# Patient Record
Sex: Female | Born: 1985
Health system: Southern US, Community
[De-identification: ages and names within clinical notes are randomized; demographics above are authoritative.]

## PROBLEM LIST (undated history)

## (undated) DIAGNOSIS — I10 Essential (primary) hypertension: Secondary | ICD-10-CM

---

## 2005-06-12 HISTORY — PX: TUBAL LIGATION: SHX77

## 2005-12-29 ENCOUNTER — Ambulatory Visit (HOSPITAL_COMMUNITY): Admission: RE | Admit: 2005-12-29 | Discharge: 2005-12-29 | Payer: Self-pay | Admitting: Obstetrics and Gynecology

## 2010-08-08 ENCOUNTER — Emergency Department (HOSPITAL_BASED_OUTPATIENT_CLINIC_OR_DEPARTMENT_OTHER)
Admission: EM | Admit: 2010-08-08 | Discharge: 2010-08-08 | Disposition: A | Discharge status: Home / Self Care | Payer: Self-pay | Attending: Emergency Medicine | Admitting: Emergency Medicine

## 2010-08-08 DIAGNOSIS — N760 Acute vaginitis: Secondary | ICD-10-CM | POA: Insufficient documentation

## 2010-08-08 DIAGNOSIS — R1032 Left lower quadrant pain: Secondary | ICD-10-CM | POA: Insufficient documentation

## 2010-08-08 DIAGNOSIS — B9689 Other specified bacterial agents as the cause of diseases classified elsewhere: Secondary | ICD-10-CM | POA: Insufficient documentation

## 2010-08-08 DIAGNOSIS — N920 Excessive and frequent menstruation with regular cycle: Secondary | ICD-10-CM | POA: Insufficient documentation

## 2010-08-08 DIAGNOSIS — F17200 Nicotine dependence, unspecified, uncomplicated: Secondary | ICD-10-CM | POA: Insufficient documentation

## 2010-08-08 DIAGNOSIS — A499 Bacterial infection, unspecified: Secondary | ICD-10-CM | POA: Insufficient documentation

## 2010-08-08 LAB — URINALYSIS, ROUTINE W REFLEX MICROSCOPIC
Protein, ur: NEGATIVE mg/dL
Urine Glucose, Fasting: NEGATIVE mg/dL
Urobilinogen, UA: 1 mg/dL (ref 0.0–1.0)

## 2010-08-08 LAB — WET PREP, GENITAL: Trich, Wet Prep: NONE SEEN

## 2010-08-08 LAB — URINE MICROSCOPIC-ADD ON

## 2013-12-29 ENCOUNTER — Emergency Department (HOSPITAL_BASED_OUTPATIENT_CLINIC_OR_DEPARTMENT_OTHER)
Admission: EM | Admit: 2013-12-29 | Discharge: 2013-12-29 | Disposition: A | Discharge status: Home / Self Care | Payer: Medicaid Other | Attending: Emergency Medicine | Admitting: Emergency Medicine

## 2013-12-29 ENCOUNTER — Encounter (HOSPITAL_BASED_OUTPATIENT_CLINIC_OR_DEPARTMENT_OTHER): Payer: Self-pay | Admitting: Emergency Medicine

## 2013-12-29 DIAGNOSIS — F17200 Nicotine dependence, unspecified, uncomplicated: Secondary | ICD-10-CM | POA: Insufficient documentation

## 2013-12-29 DIAGNOSIS — M549 Dorsalgia, unspecified: Secondary | ICD-10-CM | POA: Diagnosis not present

## 2013-12-29 DIAGNOSIS — I10 Essential (primary) hypertension: Secondary | ICD-10-CM | POA: Insufficient documentation

## 2013-12-29 DIAGNOSIS — R6884 Jaw pain: Secondary | ICD-10-CM | POA: Diagnosis present

## 2013-12-29 DIAGNOSIS — Z79899 Other long term (current) drug therapy: Secondary | ICD-10-CM | POA: Diagnosis not present

## 2013-12-29 HISTORY — DX: Essential (primary) hypertension: I10

## 2013-12-29 MED ORDER — CYCLOBENZAPRINE HCL 10 MG PO TABS: 10.0000 mg | ORAL_TABLET | Freq: Two times a day (BID) | ORAL | Status: DC | PRN

## 2013-12-29 MED ORDER — OXYCODONE-ACETAMINOPHEN 5-325 MG PO TABS
1.0000 | ORAL_TABLET | Freq: Once | ORAL | Status: AC
  Administered 2013-12-29: 1 via ORAL
  Filled 2013-12-29: qty 1

## 2013-12-29 NOTE — ED Provider Notes (Signed)
CSN: [id number]     Arrival date & time 12/29/13  2128 History   First MD Initiated Contact with Patient 12/29/13 2227     Chief Complaint  Patient presents with  . Jaw Pain     (Consider location/radiation/quality/duration/timing/severity/associated sxs/prior Treatment) The history is provided by the patient.    Pt reports she experienced about 5 minutes of sharp pains in both sides of her jaw, indicated around TMJ bilaterally.  Pain was sharp and radiated upward, felt the sensation as if her face was swelling.  It resolved spontaneously and returned again temporarily.  Was having difficulty raising and lowering jaw at the time secondary to pain.  This occurred around 8:30pm, while sitting and talking.  Denies fevers, ear pain, dental pain, sore throat.  She does not grind her teeth or clench her jaw but notes she has been stressed recently.  Notes her symptoms are completely resolved now and have been for several hours.  Also denies CP, SOB, cough.   Has also had mild soreness of her left upper back x 2 days.    Past Medical History  Diagnosis Date  . Hypertension    Past Surgical History  Procedure Laterality Date  . Cesarean section     No family history on file. History  Substance Use Topics  . Smoking status: Current Every Day Smoker -- 0.50 packs/day    Types: Cigarettes  . Smokeless tobacco: Not on file  . Alcohol Use: No   OB History   Grav Para Term Preterm Abortions TAB SAB Ect Mult Living                 Review of Systems  All other systems reviewed and are negative.     Allergies  Review of patient's allergies indicates no known allergies.  Home Medications   Prior to Admission medications   Medication Sig Start Date End Date Taking? Authorizing Provider  cyclobenzaprine (FLEXERIL) 10 MG tablet Take 1 tablet (10 mg total) by mouth 2 (two) times daily as needed for muscle spasms (or pain). 12/29/13   Trixie Dredge, PA-C   BP 128/78  Pulse 114  Temp(Src)  98.2 F (36.8 C) (Oral)  Resp 18  Ht 5\' 1"  (1.549 m)  Wt 137 lb (62.143 kg)  BMI 25.90 kg/m2  SpO2 100%  LMP 12/24/2013 Physical Exam  Nursing note and vitals reviewed. Constitutional: She appears well-developed and well-nourished. No distress.  HENT:  Head: Normocephalic and atraumatic.  Mouth/Throat: Uvula is midline, oropharynx is clear and moist and mucous membranes are normal. Normal dentition.  TMJs located, nontender bilaterally. Pt raises and lowers jaw without pain.  Dentition normal, no pain with percussion.  Bilateral TMs, canals normal.  No mastoid tenderness bilaterally.  No palpable lymphadenopathy of the head or neck.    Neck: Normal range of motion. Neck supple.  Cardiovascular: Normal rate and regular rhythm.   Pulmonary/Chest: Effort normal and breath sounds normal. No respiratory distress. She has no wheezes. She has no rales.  Abdominal: Soft. She exhibits no distension. There is no tenderness. There is no rebound and no guarding.  Musculoskeletal:       Arms: Spine nontender, no crepitus, or stepoffs.   Neurological: She is alert.  Skin: She is not diaphoretic.    ED Course  Procedures (including critical care time) Labs Review Labs Reviewed - No data to display  Imaging Review No results found.   EKG Interpretation   Date/Time:  Monday December 29 2013 21:45:45  EDT Ventricular Rate:  108 PR Interval:  160 QRS Duration: 80 QT Interval:  326 QTC Calculation: 436 R Axis:   48 Text Interpretation:  Sinus tachycardia Right atrial enlargement  Borderline ECG No old tracing to compare Confirmed by [facility]  MD, MARTHA  [phone]([id number]) on 12/29/2013 10:48:54 PM      MDM   Final diagnoses:  Jaw pain  Upper back pain on left side    Pt with bilateral jaw pain in the area of the TMJ - pt's description sounds like possible muscle spasm.  No s/s of infection - ears, dentition, pharynx, salivary glands, lymph nodes - all exam unremarkable.  Mild tenderness of left  trapezius.  No bony tenderness.  Pt and partner concerned it might be caused by her BP, which is actually quite good.  Pt tachycardic on arrival but not on my exam.  Pt d/c home with flexeril, PCP follow up.  Discussed findings, treatment, and follow up  with patient.  Pt given return precautions.  Pt verbalizes understanding and agrees with plan.          FairviewEmily Jamilya Sarrazin, PA-C 12/30/13 0022

## 2013-12-29 NOTE — Discharge Instructions (Signed)
Read the information below.  Use the prescribed medication as directed.  Please discuss all new medications with your pharmacist.  You may return to the Emergency Department at any time for worsening condition or any new symptoms that concern you.  If there is any possibility that you might be pregnant, please let your health care provider know and discuss this with the pharmacist to ensure medication safety.  If you develop fevers, ear pain, dental pain, sore throat, difficulty swallowing or breathing, swelling in your face, difficulty raising or lowering your jaw, return to the Emergency Department for a recheck.     Musculoskeletal Pain Musculoskeletal pain is muscle and boney aches and pains. These pains can occur in any part of the body. Your caregiver may treat you without knowing the cause of the pain. They may treat you if blood or urine tests, X-rays, and other tests were normal.  CAUSES There is often not a definite cause or reason for these pains. These pains may be caused by a type of germ (virus). The discomfort may also come from overuse. Overuse includes working out too hard when your body is not fit. Boney aches also come from weather changes. Bone is sensitive to atmospheric pressure changes. HOME CARE INSTRUCTIONS   Ask when your test results will be ready. Make sure you get your test results.  Only take over-the-counter or prescription medicines for pain, discomfort, or fever as directed by your caregiver. If you were given medications for your condition, do not drive, operate machinery or power tools, or sign legal documents for 24 hours. Do not drink alcohol. Do not take sleeping pills or other medications that may interfere with treatment.  Continue all activities unless the activities cause more pain. When the pain lessens, slowly resume normal activities. Gradually increase the intensity and duration of the activities or exercise.  During periods of severe pain, bed rest may be  helpful. Lay or sit in any position that is comfortable.  Putting ice on the injured area.  Put ice in a bag.  Place a towel between your skin and the bag.  Leave the ice on for 15 to 20 minutes, 3 to 4 times a day.  Follow up with your caregiver for continued problems and no reason can be found for the pain. If the pain becomes worse or does not go away, it may be necessary to repeat tests or do additional testing. Your caregiver may need to look further for a possible cause. SEEK IMMEDIATE MEDICAL CARE IF:  You have pain that is getting worse and is not relieved by medications.  You develop chest pain that is associated with shortness or breath, sweating, feeling sick to your stomach (nauseous), or throw up (vomit).  Your pain becomes localized to the abdomen.  You develop any new symptoms that seem different or that concern you. MAKE SURE YOU:   Understand these instructions.  Will watch your condition.  Will get help right away if you are not doing well or get worse. Document Released: 05/29/2005 Document Revised: 08/21/2011 Document Reviewed: 01/31/2013 [facility] Patient Information 2015 [place]. This information is not intended to replace advice given to you by your health care provider. Make sure you discuss any questions you have with your health care provider.

## 2013-12-29 NOTE — ED Notes (Signed)
Jaw pain. Sharp pain on both sides of her mouth when she opened her mouth an hour ago. Now she is having pain in her upper jaw to her ears and neck.

## 2013-12-30 NOTE — ED Provider Notes (Signed)
Medical screening examination/treatment/procedure(s) were performed by non-physician practitioner and as supervising physician I was immediately available for consultation/collaboration.   EKG Interpretation   Date/Time:  Monday December 29 2013 21:45:45 EDT Ventricular Rate:  108 PR Interval:  160 QRS Duration: 80 QT Interval:  326 QTC Calculation: 436 R Axis:   48 Text Interpretation:  Sinus tachycardia Right atrial enlargement  Borderline ECG No old tracing to compare Confirmed by [facility]  MD, MARTHA  [phone]) on 12/29/2013 10:48:54 PM        Alexa BeersJohn L Peyten Weare, MD 12/30/13 0500

## 2015-01-18 ENCOUNTER — Emergency Department (HOSPITAL_BASED_OUTPATIENT_CLINIC_OR_DEPARTMENT_OTHER)
Admission: EM | Admit: 2015-01-18 | Discharge: 2015-01-18 | Disposition: A | Discharge status: Home / Self Care | Payer: Self-pay | Attending: Emergency Medicine | Admitting: Emergency Medicine

## 2015-01-18 ENCOUNTER — Encounter (HOSPITAL_BASED_OUTPATIENT_CLINIC_OR_DEPARTMENT_OTHER): Payer: Self-pay

## 2015-01-18 DIAGNOSIS — I10 Essential (primary) hypertension: Secondary | ICD-10-CM | POA: Insufficient documentation

## 2015-01-18 DIAGNOSIS — R600 Localized edema: Secondary | ICD-10-CM | POA: Insufficient documentation

## 2015-01-18 DIAGNOSIS — Z720 Tobacco use: Secondary | ICD-10-CM | POA: Insufficient documentation

## 2015-01-18 DIAGNOSIS — R609 Edema, unspecified: Secondary | ICD-10-CM

## 2015-01-18 MED ORDER — HYDROCHLOROTHIAZIDE 12.5 MG PO TABS: 12.5000 mg | ORAL_TABLET | Freq: Every day | ORAL | Status: DC

## 2015-01-18 NOTE — ED Provider Notes (Signed)
CSN: [id number]     Arrival date & time 01/18/15  1315 History   First MD Initiated Contact with Patient 01/18/15 1339     Chief Complaint  Patient presents with  . Joint Swelling     (Consider location/radiation/quality/duration/timing/severity/associated sxs/prior Treatment) The history is provided by the patient and medical records. No language interpreter was used.     Alexa Hodge is a 29 y.o. female  with a hx of HTN presents to the Emergency Department complaining of waxing and waning swelling of her hands and feet onset 3 years.  Pt reports a Hx of HTN, but takes not medication for this.  Pt reports she was taking a medication for her HTN that also helped with her swelling.  She reports this medication was given to her last year.  Pt reports she drinks lots of water, but eats mixed nuts and drinks large amounts of gatorade, tea and soda.  Pt reports her swelling is always worse after days when she drinks gatorade or eats salty foods.  Pt denies fever, chills, headache, neck pain, chest pain, abd pain, N/V/D, weakness, syncope, dysuria.  Pt denies polyuria, polydipsia or polyphagia.    Pt also reports that 2 days ago she developed a burning feeling in the skin under her eyes bilaterally.  Denies rash, changes in soap, detergent or make-up.  Pt reports she thinks her face was swollen last night and she has had episodes of dizziness, but none currently.  No Syncope or near syncope.     Past Medical History  Diagnosis Date  . Hypertension    Past Surgical History  Procedure Laterality Date  . Cesarean section     No family history on file. History  Substance Use Topics  . Smoking status: Current Every Day Smoker  . Smokeless tobacco: Not on file  . Alcohol Use: Yes   OB History    No data available     Review of Systems  Constitutional: Negative for fever, diaphoresis, appetite change, fatigue and unexpected weight change.  HENT: Negative for mouth sores.   Eyes: Negative  for visual disturbance.  Respiratory: Negative for cough, chest tightness, shortness of breath and wheezing.   Cardiovascular: Negative for chest pain.       Hand and foot swelling  Gastrointestinal: Negative for nausea, vomiting, abdominal pain, diarrhea and constipation.  Endocrine: Negative for polydipsia, polyphagia and polyuria.  Genitourinary: Negative for dysuria, urgency, frequency and hematuria.  Musculoskeletal: Negative for back pain and neck stiffness.  Skin: Negative for rash.  Allergic/Immunologic: Negative for immunocompromised state.  Neurological: Negative for syncope, light-headedness and headaches.  Hematological: Does not bruise/bleed easily.  Psychiatric/Behavioral: Negative for sleep disturbance. The patient is not nervous/anxious.       Allergies  Review of patient's allergies indicates no known allergies.  Home Medications   Prior to Admission medications   Medication Sig Start Date End Date Taking? Authorizing Provider  hydrochlorothiazide (HYDRODIURIL) 12.5 MG tablet Take 1 tablet (12.5 mg total) by mouth daily. 01/18/15   Dandre Sisler, PA-C   BP 113/79 mmHg  Pulse 84  Temp(Src) 98.4 F (36.9 C) (Oral)  Resp 16  Ht  (1.549 m)  Wt 135 lb (61.236 kg)  BMI 25.52 kg/m2  SpO2 100%  LMP 01/04/2015 Physical Exam  Constitutional: She is oriented to person, place, and time. She appears well-developed and well-nourished. No distress.  Awake, alert, nontoxic appearance  HENT:  Head: Normocephalic and atraumatic.  Right Ear: Tympanic membrane, external ear  and ear canal normal.  Left Ear: Tympanic membrane, external ear and ear canal normal.  Nose: Nose normal. No epistaxis. Right sinus exhibits no maxillary sinus tenderness and no frontal sinus tenderness. Left sinus exhibits no maxillary sinus tenderness and no frontal sinus tenderness.  Mouth/Throat: Uvula is midline, oropharynx is clear and moist and mucous membranes are normal. Mucous membranes  are not pale and not cyanotic. No oropharyngeal exudate, posterior oropharyngeal edema, posterior oropharyngeal erythema or tonsillar abscesses.  Dry skin noted to the skin under her bilateral eyes,  No rash No facial or oropharyngeal edema  Eyes: Conjunctivae are normal. Pupils are equal, round, and reactive to light. No scleral icterus.  Neck: Normal range of motion and full passive range of motion without pain. Neck supple.  Cardiovascular: Normal rate, regular rhythm, normal heart sounds and intact distal pulses.   No murmur heard. Pulmonary/Chest: Effort normal and breath sounds normal. No stridor. No respiratory distress. She has no wheezes.  Equal chest expansion Clear and equal breath sounds  Abdominal: Soft. Bowel sounds are normal. She exhibits no mass. There is no tenderness. There is no rebound and no guarding.  Musculoskeletal: Normal range of motion. She exhibits no edema.  No edema of the upper or lower extremities noted  Lymphadenopathy:    She has no cervical adenopathy.  Neurological: She is alert and oriented to person, place, and time.  Speech is clear and goal oriented Moves extremities without ataxia  Skin: Skin is warm and dry. No rash noted. She is not diaphoretic.  Psychiatric: She has a normal mood and affect.  Nursing note and vitals reviewed.   ED Course  Procedures (including critical care time) Labs Review Labs Reviewed - No data to display  Imaging Review No results found.   EKG Interpretation None      MDM   Final diagnoses:  Edema  Essential hypertension   Alexa Hodge presents with complaints of intermittent hand and foot swelling.  No evidence of edema today; specifically no pitting edema.  No significant hypertension noted today.  Patient requests restarting her diuretic.  Counseled on diet, adequate water intake and symptoms of hypotension with diuretic usage. She is to follow-up in 3-5 days with her primary care physician.  No hx of  kidney dysfunction.    Burning underneath patient's eyes appears to be dry skin. There is no evidence of allergic reaction. No rash or peeling skin. Recommend moisturizers and more moisturizing face soap.  BP 113/79 mmHg  Pulse 84  Temp(Src) 98.4 F (36.9 C) (Oral)  Resp 16  Ht 5\' 1"  (1.549 m)  Wt 135 lb (61.236 kg)  BMI 25.52 kg/m2  SpO2 100%  LMP 01/04/2015   Dierdre Forth, PA-C 01/18/15 1453  Mirian Mo, MD 01/20/15 2253

## 2015-01-18 NOTE — ED Notes (Signed)
C/o swelling to feet, hands x 3 years-swelling to face and dizziness x 2 days

## 2015-01-18 NOTE — Discharge Instructions (Signed)
1. Medications: HCTZ, usual home medications 2. Treatment: rest, drink plenty of fluids, decrease salt intake 3. Follow Up: Please followup with your primary doctor in 3-5 days for discussion of your diagnoses and further evaluation after today's visit; if you do not have a primary care doctor use the resource guide provided to find one; Please return to the ER for worsening symptoms    DASH Eating Plan DASH stands for "Dietary Approaches to Stop Hypertension." The DASH eating plan is a healthy eating plan that has been shown to reduce high blood pressure (hypertension). Additional health benefits may include reducing the risk of type 2 diabetes mellitus, heart disease, and stroke. The DASH eating plan may also help with weight loss. WHAT DO I NEED TO KNOW ABOUT THE DASH EATING PLAN? For the DASH eating plan, you will follow these general guidelines:  Choose foods with a percent daily value for sodium of less than 5% (as listed on the food label).  Use salt-free seasonings or herbs instead of table salt or sea salt.  Check with your health care provider or pharmacist before using salt substitutes.  Eat lower-sodium products, often labeled as "lower sodium" or "no salt added."  Eat fresh foods.  Eat more vegetables, fruits, and low-fat dairy products.  Choose whole grains. Look for the word "whole" as the first word in the ingredient list.  Choose fish and skinless chicken or [place] more often than red meat. Limit fish, poultry, and meat to 6 oz (170 g) each day.  Limit sweets, desserts, sugars, and sugary drinks.  Choose heart-healthy fats.  Limit cheese to 1 oz (28 g) per day.  Eat more home-cooked food and less restaurant, buffet, and fast food.  Limit fried foods.  Cook foods using methods other than frying.  Limit canned vegetables. If you do use them, rinse them well to decrease the sodium.  When eating at a restaurant, ask that your food be prepared with less salt, or  no salt if possible. WHAT FOODS CAN I EAT? Seek help from a dietitian for individual calorie needs. Grains Whole grain or whole wheat bread. Brown rice. Whole grain or whole wheat pasta. Quinoa, bulgur, and whole grain cereals. Low-sodium cereals. Corn or whole wheat flour tortillas. Whole grain cornbread. Whole grain crackers. Low-sodium crackers. Vegetables Fresh or frozen vegetables (raw, steamed, roasted, or grilled). Low-sodium or reduced-sodium tomato and vegetable juices. Low-sodium or reduced-sodium tomato sauce and paste. Low-sodium or reduced-sodium canned vegetables.  Fruits All fresh, canned (in natural juice), or frozen fruits. Meat and Other Protein Products Ground beef (85% or leaner), grass-fed beef, or beef trimmed of fat. Skinless chicken or [place]. Ground chicken or [place]. Pork trimmed of fat. All fish and seafood. Eggs. Dried beans, peas, or lentils. Unsalted nuts and seeds. Unsalted canned beans. Dairy Low-fat dairy products, such as skim or 1% milk, 2% or reduced-fat cheeses, low-fat ricotta or cottage cheese, or plain low-fat yogurt. Low-sodium or reduced-sodium cheeses. Fats and Oils Tub margarines without trans fats. Light or reduced-fat mayonnaise and salad dressings (reduced sodium). Avocado. Safflower, olive, or canola oils. Natural peanut or almond butter. Other Unsalted popcorn and pretzels. The items listed above may not be a complete list of recommended foods or beverages. Contact your dietitian for more options. WHAT FOODS ARE NOT RECOMMENDED? Grains White bread. White pasta. White rice. Refined cornbread. Bagels and croissants. Crackers that contain trans fat. Vegetables Creamed or fried vegetables. Vegetables in a cheese sauce. Regular canned vegetables. Regular canned tomato sauce  and paste. Regular tomato and vegetable juices. Fruits Dried fruits. Canned fruit in light or heavy syrup. Fruit juice. Meat and Other Protein Products Fatty cuts of meat.  Ribs, chicken wings, bacon, sausage, bologna, salami, chitterlings, fatback, hot dogs, bratwurst, and packaged luncheon meats. Salted nuts and seeds. Canned beans with salt. Dairy Whole or 2% milk, cream, half-and-half, and cream cheese. Whole-fat or sweetened yogurt. Full-fat cheeses or blue cheese. Nondairy creamers and whipped toppings. Processed cheese, cheese spreads, or cheese curds. Condiments Onion and garlic salt, seasoned salt, table salt, and sea salt. Canned and packaged gravies. Worcestershire sauce. Tartar sauce. Barbecue sauce. Teriyaki sauce. Soy sauce, including reduced sodium. Steak sauce. Fish sauce. Oyster sauce. Cocktail sauce. Horseradish. Ketchup and mustard. Meat flavorings and tenderizers. Bouillon cubes. Hot sauce. Tabasco sauce. Marinades. Taco seasonings. Relishes. Fats and Oils Butter, stick margarine, lard, shortening, ghee, and bacon fat. Coconut, palm kernel, or palm oils. Regular salad dressings. Other Pickles and olives. Salted popcorn and pretzels. The items listed above may not be a complete list of foods and beverages to avoid. Contact your dietitian for more information. WHERE CAN I FIND MORE INFORMATION? National Heart, Lung, and Blood Institute: [employer] Document Released: 05/18/2011 Document Revised: 10/13/2013 Document Reviewed: 04/02/2013 [facility] Patient Information 2015 [place], [place]. This information is not intended to replace advice given to you by your health care provider. Make sure you discuss any questions you have with your health care provider.   Edema Edema is an abnormal buildup of fluids in your bodytissues. Edema is somewhatdependent on gravity to pull the fluid to the lowest place in your body. That makes the condition more common in the legs and thighs (lower extremities). Painless swelling of the feet and ankles is common and becomes more likely as you get older. It is also common in looser  tissues, like around your eyes.  When the affected area is squeezed, the fluid may move out of that spot and leave a dent for a few moments. This dent is called pitting.  CAUSES  There are many possible causes of edema. Eating too much salt and being on your feet or sitting for a long time can cause edema in your legs and ankles. Hot weather may make edema worse. Common medical causes of edema include:  Heart failure.  Liver disease.  Kidney disease.  Weak blood vessels in your legs.  Cancer.  An injury.  Pregnancy.  Some medications.  Obesity. SYMPTOMS  Edema is usually painless.Your skin may look swollen or shiny.  DIAGNOSIS  Your health care provider may be able to diagnose edema by asking about your medical history and doing a physical exam. You may need to have tests such as X-rays, an electrocardiogram, or blood tests to check for medical conditions that may cause edema.  TREATMENT  Edema treatment depends on the cause. If you have heart, liver, or kidney disease, you need the treatment appropriate for these conditions. General treatment may include:  Elevation of the affected body part above the level of your heart.  Compression of the affected body part. Pressure from elastic bandages or support stockings squeezes the tissues and forces fluid back into the blood vessels. This keeps fluid from entering the tissues.  Restriction of fluid and salt intake.  Use of a water pill (diuretic). These medications are appropriate only for some types of edema. They pull fluid out of your body and make you urinate more often. This gets rid of fluid and reduces swelling, but diuretics can  have side effects. Only use diuretics as directed by your health care provider. HOME CARE INSTRUCTIONS   Keep the affected body part above the level of your heart when you are lying down.   Do not sit still or stand for prolonged periods.   Do not put anything directly under your knees when  lying down.  Do not wear constricting clothing or garters on your upper legs.   Exercise your legs to work the fluid back into your blood vessels. This may help the swelling go down.   Wear elastic bandages or support stockings to reduce ankle swelling as directed by your health care provider.   Eat a low-salt diet to reduce fluid if your health care provider recommends it.   Only take medicines as directed by your health care provider. SEEK MEDICAL CARE IF:   Your edema is not responding to treatment.  You have heart, liver, or kidney disease and notice symptoms of edema.  You have edema in your legs that does not improve after elevating them.   You have sudden and unexplained weight gain. SEEK IMMEDIATE MEDICAL CARE IF:   You develop shortness of breath or chest pain.   You cannot breathe when you lie down.  You develop pain, redness, or warmth in the swollen areas.   You have heart, liver, or kidney disease and suddenly get edema.  You have a fever and your symptoms suddenly get worse. MAKE SURE YOU:   Understand these instructions.  Will watch your condition.  Will get help right away if you are not doing well or get worse. Document Released: 05/29/2005 Document Revised: 10/13/2013 Document Reviewed: 03/21/2013 [facility] Patient Information 2015 [place], [place]. This information is not intended to replace advice given to you by your health care provider. Make sure you discuss any questions you have with your health care provider.   Hypertension Hypertension, commonly called high blood pressure, is when the force of blood pumping through your arteries is too strong. Your arteries are the blood vessels that carry blood from your heart throughout your body. A blood pressure reading consists of a higher number over a lower number, such as 110/72. The higher number (systolic) is the pressure inside your arteries when your heart pumps. The lower number (diastolic) is  the pressure inside your arteries when your heart relaxes. Ideally you want your blood pressure below 120/80. Hypertension forces your heart to work harder to pump blood. Your arteries may become narrow or stiff. Having hypertension puts you at risk for heart disease, stroke, and other problems.  RISK FACTORS Some risk factors for high blood pressure are controllable. Others are not.  Risk factors you cannot control include:   Race. You may be at higher risk if you are African American.  Age. Risk increases with age.  Gender. Men are at higher risk than women before age 26 years. After age 67, women are at higher risk than men. Risk factors you can control include:  Not getting enough exercise or physical activity.  Being overweight.  Getting too much fat, sugar, calories, or salt in your diet.  Drinking too much alcohol. SIGNS AND SYMPTOMS Hypertension does not usually cause signs or symptoms. Extremely high blood pressure (hypertensive crisis) may cause headache, anxiety, shortness of breath, and nosebleed. DIAGNOSIS  To check if you have hypertension, your health care provider will measure your blood pressure while you are seated, with your arm held at the level of your heart. It should be measured at  least twice using the same arm. Certain conditions can cause a difference in blood pressure between your right and left arms. A blood pressure reading that is higher than normal on one occasion does not mean that you need treatment. If one blood pressure reading is high, ask your health care provider about having it checked again. TREATMENT  Treating high blood pressure includes making lifestyle changes and possibly taking medicine. Living a healthy lifestyle can help lower high blood pressure. You may need to change some of your habits. Lifestyle changes may include:  Following the DASH diet. This diet is high in fruits, vegetables, and whole grains. It is low in salt, red meat, and  added sugars.  Getting at least 2 hours of brisk physical activity every week.  Losing weight if necessary.  Not smoking.  Limiting alcoholic beverages.  Learning ways to reduce stress. If lifestyle changes are not enough to get your blood pressure under control, your health care provider may prescribe medicine. You may need to take more than one. Work closely with your health care provider to understand the risks and benefits. HOME CARE INSTRUCTIONS  Have your blood pressure rechecked as directed by your health care provider.   Take medicines only as directed by your health care provider. Follow the directions carefully. Blood pressure medicines must be taken as prescribed. The medicine does not work as well when you skip doses. Skipping doses also puts you at risk for problems.   Do not smoke.   Monitor your blood pressure at home as directed by your health care provider. SEEK MEDICAL CARE IF:   You think you are having a reaction to medicines taken.  You have recurrent headaches or feel dizzy.  You have swelling in your ankles.  You have trouble with your vision. SEEK IMMEDIATE MEDICAL CARE IF:  You develop a severe headache or confusion.  You have unusual weakness, numbness, or feel faint.  You have severe chest or abdominal pain.  You vomit repeatedly.  You have trouble breathing. MAKE SURE YOU:   Understand these instructions.  Will watch your condition.  Will get help right away if you are not doing well or get worse. Document Released: 05/29/2005 Document Revised: 10/13/2013 Document Reviewed: 03/21/2013 [facility] Patient Information 2015 [place], [place]. This information is not intended to replace advice given to you by your health care provider. Make sure you discuss any questions you have with your health care provider.    Emergency Department Resource Guide 1) Find a Doctor and Pay Out of Pocket Although you won't have to find out who is covered  by your insurance plan, it is a good idea to ask around and get recommendations. You will then need to call the office and see if the doctor you have chosen will accept you as a new patient and what types of options they offer for patients who are self-pay. Some doctors offer discounts or will set up payment plans for their patients who do not have insurance, but you will need to ask so you aren't surprised when you get to your appointment.  2) Contact Your Local Health Department Not all health departments have doctors that can see patients for sick visits, but many do, so it is worth a call to see if yours does. If you don't know where your local health department is, you can check in your phone book. The CDC also has a tool to help you locate your state's health department, and many state websites  also have listings of all of their local health departments.  3) Find a Walk-in Clinic If your illness is not likely to be very severe or complicated, you may want to try a walk in clinic. These are popping up all over the country in pharmacies, drugstores, and shopping centers. They're usually staffed by nurse practitioners or physician assistants that have been trained to treat common illnesses and complaints. They're usually fairly quick and inexpensive. However, if you have serious medical issues or chronic medical problems, these are probably not your best option.  No Primary Care Doctor: - Call Health Connect at  [phone] - they can help you locate a primary care doctor that  accepts your insurance, provides certain services, etc. - Physician Referral Service- [phone]  Chronic Pain Problems: Organization         Address  Phone   Notes  [facility]  [phone] Patients need to be referred by their primary care doctor.   Medication Assistance: Organization         Address  Phone   Notes  [facility] Medication [facility] [address] [place] [place], [place] [address] [phone] --Must be a resident of [facility] -- Must have NO insurance coverage whatsoever (no Medicaid/ Medicare, etc.) -- The pt. MUST have a primary care doctor that directs their care regularly and follows them in the community   [facility]  [phone]   [employer]  [phone]    Agencies that provide inexpensive medical care: Organization         Address  Phone   Notes  [facility]  [phone]   [facility]    [phone]   [facility] [address] [place], [place] [address] [phone]   [facility] [address], [place] [phone]   Planned Parenthood    [phone]   [facility]    [phone]   Community Health and [facility]  [address], [place] Phone:  [phone], Fax:  [phone] Hours of Operation:  9 am - 6 pm, M-F.  Also accepts Medicaid/Medicare and self-pay.  [facility]  [address], [place] Phone: [phone], Fax: [phone]. Hours of Operation:  8:30 am - 5:30 pm, M-F.  Also accepts Medicaid and self-pay.  [facility] [place] [address] [place] Phone: [phone]   [facility] [address] Natasha Bence [place], [place] [phone], Ext. [phone] Mondays & Thursdays: 7-9 AM.  First 15 patients are seen on a first come, first serve basis.    Medicaid-accepting [facility] Providers:  Organization         Address  Phone   Notes  [facility] [address], [place] [phone] Also accepts self-pay patients.  [facility] [address] Laurell Josephs [place], [place]  [phone]   [facility] [address], [place] [phone]   [facility] [address] [place] [phone]   Renaye Rakers [address], [place]   [phone] Only accepts [place] Access [place] patients after they have their name applied to their card.   Self-Pay (no insurance) in [facility]:  [employer]  Notes  Sickle Cell Patients, [facility] [address] [place], [place] [phone]   [facility] [address] [place], [place] [phone]   [facility]  [address], [place] [phone]   [facility]/Dr. Osei-Bonsu  [address], [place] or [address], [place] [phone] Phone number for both [place] and [place] locations is the same.  [facility] [address] [place] [phone]   [facility] [address] [place] or [address] [phone]   [facility] [address] [place] [phone], phone; [phone], fax Sees patients 1st and 3rd Saturday of every month.  Must not qualify for public or private insurance (i.e. Medicaid, Medicare, [place] Health Choice, Veterans' Benefits)  Household income should be no more than 200% of the poverty level The clinic cannot treat you if you are pregnant or think you are pregnant  Sexually transmitted diseases are not treated at the clinic.    Dental Care: Organization         Address  Phone  Notes  [facility] Department of [facility] [address] [place], [place] [phone] Accepts children up to age 35 who are enrolled in [place] or [place] Health Choice; pregnant women with a Medicaid card; and children who have applied for Medicaid or [place] Health Choice, but were declined, whose parents can pay a reduced fee at time of service.  [facility] Department of [facility]  [address], [place] [phone] Accepts children up to age 50 who are enrolled  in [place] or [place] Health Choice; pregnant women with a Medicaid card; and children who have applied for Medicaid or [place] Health Choice, but were declined, whose parents can pay a reduced fee at time of service.  [facility]  [address] [place], [place] [phone] Patients are seen by appointment only. Walk-ins are not accepted. [facility] will see patients 68 years of age and older. Monday - Tuesday (8am-5pm) Most Wednesdays (8:30-5pm) $30 per visit, cash only  [facility]  [address], [facility] [phone] Patients are seen by appointment only. Walk-ins are not accepted. [facility] will see patients 11 years of age and older. One Wednesday Evening (Monthly: Volunteer Based).  $30 per visit, cash only  [employer]  [phone] for adults; Children under age 51, call Graduate Pediatric Dentistry at [phone]. Children aged 51-14, please call [phone] to request a pediatric application.  Dental services are provided in all areas of dental care including fillings, crowns and bridges, complete and partial dentures, implants, gum treatment, root canals, and extractions. Preventive care is also provided. Treatment is provided to both adults and children. Patients are selected via a lottery and there is often a waiting list.   [facility] [address] [place]  [phone] [email or web address]   [facility] [address] [place], [place] [phone], Ext. [phone] Second and Fourth Thursday of each month, opens at 6:30 AM; Clinic ends at 9 AM.  Patients are seen on a first-come first-served basis, and a limited number are seen during each clinic.   [facility]  [address] Ether Griffins [place], [place] [phone]  Eligibility Requirements You must have lived in [place], [place], or [place] counties for at least the last three months.   You cannot be  eligible for state or federal sponsored [place], including [employer], [place], or [employer].   You generally cannot be eligible for healthcare insurance through your employer.    How to apply: Eligibility screenings are held every Tuesday and Wednesday afternoon from 1:00 pm until 4:00 pm. You do not need an appointment for the interview!  [facility] [address], [place], [place] [phone]   [facility] Health Department  [phone]   [facility] Health Department  [phone]   [facility] Health Department  [phone]    Behavioral Health Resources in the Community: Intensive Outpatient Programs Organization         Address  Phone  Notes  [facility] Services [address], [place], [place] [phone]   [facility]patient [address], [place], [place] [phone]   [facility] [address] [place], [place]  [phone]   [facility] Mental Health [address]  [place], [place] [phone] or [phone]   Substance Abuse Resources Organization         Address  Phone  Notes  [facility]  [phone]   [facility]  [phone]   [facility] [place] [facility]  [phone]   Floydene Flock  [phone]   Residential & Outpatient Substance Abuse Program  [phone]   Psychological Services Organization         Address  Phone  Notes  [facility] Behavioral Health  [phone]   [facility] Services  [phone]   [facility] Mental Health [address] [place] [phone] or [phone]    Mobile Crisis Teams Organization         Address  Phone  Notes  [facility], Mobile Crisis Care Unit  [phone]   [facility]  [address] [place], [place] [phone]   Doristine Locks [address] [place] [phone]    Self-Help/Support Groups Organization          Address  Phone             Notes  [facility] [place] - variety of support groups  [phone] Call for more information  Narcotics Anonymous (NA), [facility] [address], [employer] [place]  2 meetings at this location   Statistician         Address  Phone  Notes  [facility] [address],    [place]  [phone]   [facility]  [address] [place] [address], [place], [place] [phone]   [facility] [address] [place], [place] [phone] Admissions: 8am-3pm M-F  [facility] [address]    [place], [place] [phone]   [facility] [address]ling Manns [place], [place] [phone]   [facility] [address],  [place], [place] [phone]   [facility] [address], [place], [place] [phone]   [facility] [address] [place]  [place], [place] [phone] or [phone]   [facility] [address], [place], [place] [phone] Accepts Medicaid  [facility] [address],  [place] [phone] Substance Abuse/Addiction Treatment   [facility] Organization  Address  Phone  Notes  [facility]  [phone]   Angie Fava, PhD [address]vin Knack [place], [place]   [phone] or [phone]   [facility]   [address] [place], [place] [phone]   [facility] [address] [place], [place] [phone] Insurance/Medicaid/sponsorship through [facility] Families [address]                                    [place], [place] [phone] Therapy/tele-psych/case  [facility] [address], [place] [phone]    Dr. Lolly Mustache  [phone]   [facility]  [employer]  [facility] Dept. 1) [address] [place] 2) [address], [place] 3)  [address], [place] [phone]  [phone]   [facility] Child Abuse Hotline [phone] or [phone] (After Hours)

## 2015-01-18 NOTE — ED Notes (Signed)
Pt complains of facial, bilateral hands/feet swelling. No edema noted. Strong pedal pulses.

## 2015-01-19 ENCOUNTER — Encounter (HOSPITAL_BASED_OUTPATIENT_CLINIC_OR_DEPARTMENT_OTHER): Payer: Self-pay

## 2015-05-30 ENCOUNTER — Emergency Department (HOSPITAL_BASED_OUTPATIENT_CLINIC_OR_DEPARTMENT_OTHER)
Admission: EM | Admit: 2015-05-30 | Discharge: 2015-05-30 | Disposition: A | Discharge status: Home / Self Care | Payer: Medicaid Other | Attending: Emergency Medicine | Admitting: Emergency Medicine

## 2015-05-30 ENCOUNTER — Encounter (HOSPITAL_BASED_OUTPATIENT_CLINIC_OR_DEPARTMENT_OTHER): Payer: Self-pay | Admitting: Emergency Medicine

## 2015-05-30 DIAGNOSIS — I10 Essential (primary) hypertension: Secondary | ICD-10-CM | POA: Diagnosis not present

## 2015-05-30 DIAGNOSIS — L0291 Cutaneous abscess, unspecified: Secondary | ICD-10-CM

## 2015-05-30 DIAGNOSIS — L0231 Cutaneous abscess of buttock: Secondary | ICD-10-CM | POA: Diagnosis not present

## 2015-05-30 DIAGNOSIS — Z79899 Other long term (current) drug therapy: Secondary | ICD-10-CM | POA: Diagnosis not present

## 2015-05-30 DIAGNOSIS — F17210 Nicotine dependence, cigarettes, uncomplicated: Secondary | ICD-10-CM | POA: Insufficient documentation

## 2015-05-30 NOTE — Discharge Instructions (Signed)
Abscess °An abscess is an infected area that contains a collection of pus and debris. It can occur in almost any part of the body. An abscess is also known as a furuncle or boil. °CAUSES  °An abscess occurs when tissue gets infected. This can occur from blockage of oil or sweat glands, infection of hair follicles, or a minor injury to the skin. As the body tries to fight the infection, pus collects in the area and creates pressure under the skin. This pressure causes pain. People with weakened immune systems have difficulty fighting infections and get certain abscesses more often.  °SYMPTOMS °Usually an abscess develops on the skin and becomes a painful mass that is red, warm, and tender. If the abscess forms under the skin, you may feel a moveable soft area under the skin. Some abscesses break open (rupture) on their own, but most will continue to get worse without care. The infection can spread deeper into the body and eventually into the bloodstream, causing you to feel ill.  °DIAGNOSIS  °Your caregiver will take your medical history and perform a physical exam. A sample of fluid may also be taken from the abscess to determine what is causing your infection. °TREATMENT  °Your caregiver may prescribe antibiotic medicines to fight the infection. However, taking antibiotics alone usually does not cure an abscess. Your caregiver may need to make a small cut (incision) in the abscess to drain the pus. In some cases, gauze is packed into the abscess to reduce pain and to continue draining the area. °HOME CARE INSTRUCTIONS  °· Only take over-the-counter or prescription medicines for pain, discomfort, or fever as directed by your caregiver. °· If you were prescribed antibiotics, take them as directed. Finish them even if you start to feel better. °· If gauze is used, follow your caregiver's directions for changing the gauze. °· To avoid spreading the infection: °¨ Keep your draining abscess covered with a  bandage. °¨ Wash your hands well. °¨ Do not share personal care items, towels, or whirlpools with others. °¨ Avoid skin contact with others. °· Keep your skin and clothes clean around the abscess. °· Keep all follow-up appointments as directed by your caregiver. °SEEK MEDICAL CARE IF:  °· You have increased pain, swelling, redness, fluid drainage, or bleeding. °· You have muscle aches, chills, or a general ill feeling. °· You have a fever. °MAKE SURE YOU:  °· Understand these instructions. °· Will watch your condition. °· Will get help right away if you are not doing well or get worse. °  °This information is not intended to replace advice given to you by your health care provider. Make sure you discuss any questions you have with your health care provider. °  °Document Released: 03/08/2005 Document Revised: 11/28/2011 Document Reviewed: 08/11/2011 °Elsevier Interactive Patient Education ©2016 Elsevier Inc[employer] ° ° °Please read attached information. If you experience any new or worsening signs or symptoms please return to the emergency room for evaluation. Please follow-up with your primary care provider or specialist as discussed. Please use medication prescribed only as directed and discontinue taking if you have any concerning signs or symptoms.  °

## 2015-05-30 NOTE — ED Notes (Signed)
Pt states she'll take BP meds immediately when she gets home; PA-C aware--okay to discharge.

## 2015-05-30 NOTE — ED Provider Notes (Signed)
CSN: [id number]     Arrival date & time 05/30/15  1345 History   First MD Initiated Contact with Patient 05/30/15 1406     Chief Complaint  Patient presents with  . Recurrent Skin Infections    HPI   29 year old female presents today with an abscess to her gluteus. Patient reports a past significant medical history the same approximately 7 months ago that was treated with oral antibiotics that improved. Patient reports she's been asymptomatic until 3 days ago when she started developing swelling pain to the left buttock just posterior lateral to the coccyx. Patient reports that abscess drained yesterday and has significantly diminished in size. Husband requested she be seen and evaluated. Patient denies any surrounding cellulitis, fever, chills, nausea, vomiting, or any other concerning signs or symptoms for infectious etiology. Patient has not tried any over-the-counter medications prior to arrival    Past Medical History  Diagnosis Date  . Hypertension    Past Surgical History  Procedure Laterality Date  . Cesarean section     History reviewed. No pertinent family history. Social History  Substance Use Topics  . Smoking status: Current Every Day Smoker -- 0.50 packs/day    Types: Cigarettes  . Smokeless tobacco: None  . Alcohol Use: Yes   OB History    Gravida Para Term Preterm AB TAB SAB Ectopic Multiple Living   0 0 0 0 0 0 0 0       Review of Systems  All other systems reviewed and are negative.     Allergies  Review of patient's allergies indicates no known allergies.  Home Medications   Prior to Admission medications   Medication Sig Start Date End Date Taking? Authorizing Provider  cyclobenzaprine (FLEXERIL) 10 MG tablet Take 1 tablet (10 mg total) by mouth 2 (two) times daily as needed for muscle spasms (or pain). 12/29/13   Trixie Dredge, PA-C  hydrochlorothiazide (HYDRODIURIL) 12.5 MG tablet Take 1 tablet (12.5 mg total) by mouth daily. 01/18/15   Hannah  Muthersbaugh, PA-C   BP 139/106 mmHg  Pulse 84  Temp(Src) 98.6 F (37 C) (Oral)  Resp 18  Ht  (1.549 m)  Wt 63.186 kg  BMI 26.33 kg/m2  SpO2 100%  LMP 05/30/2015   Physical Exam  Constitutional: She is oriented to person, place, and time. She appears well-developed and well-nourished.  HENT:  Head: Normocephalic and atraumatic.  Eyes: Conjunctivae are normal. Pupils are equal, round, and reactive to light. Right eye exhibits no discharge. Left eye exhibits no discharge. No scleral icterus.  Neck: Normal range of motion. No JVD present. No tracheal deviation present.  Pulmonary/Chest: Effort normal. No stridor.  Neurological: She is alert and oriented to person, place, and time. Coordination normal.  Skin:  0.5 cm nodule with area of resolved discharge, minimally tender to palpation, no warmth to touch, no redness or, no fluctuance  Psychiatric: She has a normal mood and affect. Her behavior is normal. Judgment and thought content normal.  Nursing note and vitals reviewed.   ED Course  Procedures (including critical care time) Labs Review Labs Reviewed - No data to display  Imaging Review No results found. I have personally reviewed and evaluated these images and lab results as part of my medical decision-making.   EKG Interpretation None      MDM   Final diagnoses:  Abscess    Labs:  Imaging:  Consults:  Therapeutics:  Discharge Meds:   Assessment/Plan: 29 year old female presents today with resolving  abscess. Very small nodule with no signs of surrounding cellulitis, already drained, no systemic symptoms. No need for oral antibiotics, no drainable abscess. Patient will be instructed to use warm compresses, monitor for any new or worsening signs or symptoms and return if they present. Patient verbalized understanding and agreement with today's plan and had no further questions or concerns at time of discharge.         Eyvonne MechanicJeffrey Josemiguel Gries,  PA-C 05/30/15 1447  Nelva Nayobert Beaton, MD 05/31/15 1250

## 2015-05-30 NOTE — ED Notes (Signed)
Patient states that she was here a little while ago to have her abcess drained. The patient reports that it is back.

## 2016-04-22 ENCOUNTER — Encounter (HOSPITAL_BASED_OUTPATIENT_CLINIC_OR_DEPARTMENT_OTHER): Payer: Self-pay | Admitting: *Deleted

## 2016-04-22 ENCOUNTER — Emergency Department (HOSPITAL_BASED_OUTPATIENT_CLINIC_OR_DEPARTMENT_OTHER)
Admission: EM | Admit: 2016-04-22 | Discharge: 2016-04-22 | Disposition: A | Discharge status: Home / Self Care | Payer: Medicaid Other | Attending: Emergency Medicine | Admitting: Emergency Medicine

## 2016-04-22 DIAGNOSIS — X500XXA Overexertion from strenuous movement or load, initial encounter: Secondary | ICD-10-CM | POA: Insufficient documentation

## 2016-04-22 DIAGNOSIS — Z79899 Other long term (current) drug therapy: Secondary | ICD-10-CM | POA: Insufficient documentation

## 2016-04-22 DIAGNOSIS — S39012A Strain of muscle, fascia and tendon of lower back, initial encounter: Secondary | ICD-10-CM

## 2016-04-22 DIAGNOSIS — I10 Essential (primary) hypertension: Secondary | ICD-10-CM

## 2016-04-22 DIAGNOSIS — Y929 Unspecified place or not applicable: Secondary | ICD-10-CM | POA: Insufficient documentation

## 2016-04-22 DIAGNOSIS — H02843 Edema of right eye, unspecified eyelid: Secondary | ICD-10-CM

## 2016-04-22 DIAGNOSIS — F17210 Nicotine dependence, cigarettes, uncomplicated: Secondary | ICD-10-CM | POA: Insufficient documentation

## 2016-04-22 DIAGNOSIS — Y990 Civilian activity done for income or pay: Secondary | ICD-10-CM | POA: Insufficient documentation

## 2016-04-22 DIAGNOSIS — Y93F2 Activity, caregiving, lifting: Secondary | ICD-10-CM | POA: Insufficient documentation

## 2016-04-22 DIAGNOSIS — H02841 Edema of right upper eyelid: Secondary | ICD-10-CM | POA: Insufficient documentation

## 2016-04-22 LAB — URINALYSIS, ROUTINE W REFLEX MICROSCOPIC
Bilirubin Urine: NEGATIVE
Glucose, UA: NEGATIVE mg/dL
KETONES UR: NEGATIVE mg/dL
LEUKOCYTES UA: NEGATIVE
Nitrite: NEGATIVE
PROTEIN: NEGATIVE mg/dL
Specific Gravity, Urine: 1.025 (ref 1.005–1.030)
pH: 6.5 (ref 5.0–8.0)

## 2016-04-22 LAB — URINE MICROSCOPIC-ADD ON

## 2016-04-22 LAB — PREGNANCY, URINE: PREG TEST UR: NEGATIVE

## 2016-04-22 MED ORDER — HYDROCHLOROTHIAZIDE 12.5 MG PO TABS: 12.5000 mg | ORAL_TABLET | Freq: Every day | ORAL | 0 refills | Status: DC

## 2016-04-22 MED ORDER — CLINDAMYCIN HCL 150 MG PO CAPS: 300.0000 mg | ORAL_CAPSULE | Freq: Three times a day (TID) | ORAL | 0 refills | Status: AC

## 2016-04-22 NOTE — ED Triage Notes (Signed)
Pt reports waking up yesterday morning and R eye was swollen; reports eye lid is painful, but no pain in eye. Denies sensitivity to light, fever, vision changes. Reports minimal yellow drainage.

## 2016-04-22 NOTE — ED Provider Notes (Signed)
[facility]   CSN: [id number] Arrival date & time: 04/22/16  0809     History   Chief Complaint Chief Complaint  Patient presents with  . Eye Problem  . Back Pain    HPI Alexa Maxinshley Pierpoint is a 30 y.o. female.  30 year old female with hypertension who presents with right eye swelling and low back pain. 2 days ago, the patient noticed some swelling of her right upper eyelid. The swelling has persisted and she reports some pain of her eyelid. She denies any trauma or foreign bodies in her eye. She states that her eye feels dry but no significant drainage. No pain of her eye/globe with eye movements. She has had a runny nose but no fever or other cold symptoms. She denies any exposure to potential allergens. Normal vision. No contact lens use.  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 The pain is worse with certain movements. She denies any leg weakness, saddle anesthesia, bowel/bladder incontinence, or numbness. No trauma.  Patient also notes that she is currently out of her blood pressure medication.   The history is provided by the patient.  Eye Problem    Back Pain      Past Medical History:  Diagnosis Date  . Hypertension     There are no active problems to display for this patient.   Past Surgical History:  Procedure Laterality Date  . CESAREAN SECTION    . TUBAL LIGATION  2007    OB History    Gravida Para Term Preterm AB Living   0 0 0 0 0     SAB TAB Ectopic Multiple Live Births   0 0 0           Home Medications    Prior to Admission medications   Medication Sig Start Date End Date Taking? Authorizing Provider  clindamycin (CLEOCIN) 150 MG capsule Take 2 capsules (300 mg total) by mouth 3 (three) times daily. 04/22/16 04/29/16  Laurence Spatesachel Morgan Aishah Teffeteller, MD  cyclobenzaprine (FLEXERIL) 10 MG tablet Take 1 tablet (10 mg total) by mouth 2 (two) times daily as needed for muscle  spasms (or pain). 12/29/13   Trixie DredgeEmily West, PA-C  hydrochlorothiazide (HYDRODIURIL) 12.5 MG tablet Take 1 tablet (12.5 mg total) by mouth daily. 04/22/16   Laurence Spatesachel Morgan Deadrick Stidd, MD    Family History No family history on file.  Social History Social History  Substance Use Topics  . Smoking status: Current Every Day Smoker    Packs/day: 0.50    Types: Cigarettes  . Smokeless tobacco: Never Used  . Alcohol use Yes     Comment: several times per yr     Allergies   Patient has no known allergies.   Review of Systems Review of Systems  Musculoskeletal: Positive for back pain.   10 Systems reviewed and are negative for acute change except as noted in the HPI.   Physical Exam Updated Vital Signs BP (!) 140/107 (BP Location: Right Arm) Comment: pt reports running out of BP med (hasn't had med for approx several months)  Pulse 79   Temp 97.7 F (36.5 C) (Oral)   Resp 16   Ht 5\' 1"  (1.549 m)   Wt 130 lb (59 kg)   LMP 04/20/2016 (Exact Date)   SpO2 100%   BMI 24.56 kg/m   Physical Exam  Constitutional: She is oriented to person, place, and time. She appears well-developed and  well-nourished. No distress.  HENT:  Head: Normocephalic and atraumatic.  Moist mucous membranes  Eyes: Conjunctivae and EOM are normal. Pupils are equal, round, and reactive to light. Right eye exhibits no discharge. Left eye exhibits no discharge.  Swelling of R upper eyelid and mild swelling of lower eyelid; no drainage from eye; no pain w/ EOM  Neck: Neck supple.  Cardiovascular: Normal rate, regular rhythm and normal heart sounds.   No murmur heard. Pulmonary/Chest: Effort normal and breath sounds normal.  Abdominal: Soft. Bowel sounds are normal. She exhibits no distension. There is no tenderness.  Musculoskeletal: She exhibits no edema.  TTP R lumbar paraspinal muscles, no midline spinal tenderness, no rash; normal strength BLE  Neurological: She is alert and oriented to person, place, and time.  She displays normal reflexes. No sensory deficit. She exhibits normal muscle tone.  Fluent speech  Skin: Skin is warm and dry.  Very mild erythema R upper eyelid  Psychiatric: She has a normal mood and affect. Judgment normal.  Nursing note and vitals reviewed.    ED Treatments / Results  Labs (all labs ordered are listed, but only abnormal results are displayed) Labs Reviewed  URINALYSIS, ROUTINE W REFLEX MICROSCOPIC (NOT [facility]) - Abnormal; Notable for the following:       Result Value   Hgb urine dipstick TRACE (*)    All other components within normal limits  URINE MICROSCOPIC-ADD ON - Abnormal; Notable for the following:    Squamous Epithelial / LPF 0-5 (*)    Bacteria, UA FEW (*)    All other components within normal limits  PREGNANCY, URINE    EKG  EKG Interpretation None       Radiology No results found.  Procedures Procedures (including critical care time)  Medications Ordered in ED Medications - No data to display    Visual Acuity  Right Eye Distance: 20/30 Left Eye Distance: 20/50 Bilateral Distance: 20/30 (Pt prescribed eye glasses, but does not wear them.)  Right Eye Near:   Left Eye Near:    Bilateral Near:      Initial Impression / Assessment and Plan / ED Course  I have reviewed the triage vital signs and the nursing notes.  Pertinent labs that were available during my care of the patient were reviewed by me and considered in my medical decision making (see chart for details).  Clinical Course    1. Eye pain: Patient has almost normal vision despite not having her glasses with her. No drainage or pain with extraocular movements. Given no significant pain at rest and reassuring exam, I suspect early preorbital cellulitis versus allergic reaction and feel that orbital cellulitis is very unlikely. Gave clindamycin and extensively reviewed return precautions including any worsening symptoms, pain with eye movement, fever, or significant  drainage.  2. R lumbar back pain: UPT negative, UA without evidence of infection or blood. Patient demonstrates no lower extremity weakness, saddle anesthesia, bowel or bladder incontinence, or any other neurologic deficits concerning for cauda equina. No fevers or other infectious symptoms to suggest by the patient's back pain is due to an infection. I have reviewed return precautions, including the development of any of these signs or symptoms, and the patient has voiced understanding. I reviewed supportive care instructions, including NSAIDs, early range of motion exercises, and PCP follow-up if symptoms do not improve..   3. Hypertension: gave refill of HCTZ and emphasized the importance of PCP follow-up.  Patient  discharged in satisfactory condition.  Final Clinical Impressions(s) / ED Diagnoses   Final diagnoses:  Swelling of right eyelid  Strain of lumbar region, initial encounter  Essential hypertension    New Prescriptions New Prescriptions   CLINDAMYCIN (CLEOCIN) 150 MG CAPSULE    Take 2 capsules (300 mg total) by mouth 3 (three) times daily.   HYDROCHLOROTHIAZIDE (HYDRODIURIL) 12.5 MG TABLET    Take 1 tablet (12.5 mg total) by mouth daily.     Laurence Spates, MD 04/22/16 [phone]

## 2016-04-22 NOTE — ED Notes (Signed)
Pt ambulating to and from restroom, in NAD.

## 2016-04-22 NOTE — ED Triage Notes (Signed)
Pt reports R lower back pain since Thursday morning; Denies genitourinary symptoms. Denies known injury but reports lifting heavy objects for her job. Denies numbness/tingling, radiation down legs.

## 2017-08-13 ENCOUNTER — Emergency Department (HOSPITAL_COMMUNITY): Payer: No Typology Code available for payment source

## 2017-08-13 ENCOUNTER — Encounter (HOSPITAL_COMMUNITY): Payer: Self-pay

## 2017-08-13 ENCOUNTER — Emergency Department (HOSPITAL_COMMUNITY)
Admission: EM | Admit: 2017-08-13 | Discharge: 2017-08-13 | Disposition: A | Discharge status: Home / Self Care | Payer: No Typology Code available for payment source | Attending: Emergency Medicine | Admitting: Emergency Medicine

## 2017-08-13 DIAGNOSIS — Y9389 Activity, other specified: Secondary | ICD-10-CM | POA: Diagnosis not present

## 2017-08-13 DIAGNOSIS — S161XXA Strain of muscle, fascia and tendon at neck level, initial encounter: Secondary | ICD-10-CM | POA: Diagnosis not present

## 2017-08-13 DIAGNOSIS — I10 Essential (primary) hypertension: Secondary | ICD-10-CM | POA: Diagnosis not present

## 2017-08-13 DIAGNOSIS — R0789 Other chest pain: Secondary | ICD-10-CM | POA: Diagnosis not present

## 2017-08-13 DIAGNOSIS — R51 Headache: Secondary | ICD-10-CM | POA: Insufficient documentation

## 2017-08-13 DIAGNOSIS — M79631 Pain in right forearm: Secondary | ICD-10-CM | POA: Diagnosis not present

## 2017-08-13 DIAGNOSIS — Y998 Other external cause status: Secondary | ICD-10-CM | POA: Insufficient documentation

## 2017-08-13 DIAGNOSIS — Y92410 Unspecified street and highway as the place of occurrence of the external cause: Secondary | ICD-10-CM | POA: Diagnosis not present

## 2017-08-13 DIAGNOSIS — S199XXA Unspecified injury of neck, initial encounter: Secondary | ICD-10-CM | POA: Diagnosis present

## 2017-08-13 DIAGNOSIS — S39012A Strain of muscle, fascia and tendon of lower back, initial encounter: Secondary | ICD-10-CM | POA: Diagnosis not present

## 2017-08-13 DIAGNOSIS — F17210 Nicotine dependence, cigarettes, uncomplicated: Secondary | ICD-10-CM | POA: Insufficient documentation

## 2017-08-13 LAB — COMPREHENSIVE METABOLIC PANEL
ALK PHOS: 49 U/L (ref 38–126)
ALT: 9 U/L — AB (ref 14–54)
ANION GAP: 9 (ref 5–15)
AST: 24 U/L (ref 15–41)
Albumin: 4.1 g/dL (ref 3.5–5.0)
BUN: 17 mg/dL (ref 6–20)
CO2: 23 mmol/L (ref 22–32)
CREATININE: 0.65 mg/dL (ref 0.44–1.00)
Calcium: 9.2 mg/dL (ref 8.9–10.3)
Chloride: 107 mmol/L (ref 101–111)
Glucose, Bld: 91 mg/dL (ref 65–99)
Potassium: 3.6 mmol/L (ref 3.5–5.1)
SODIUM: 139 mmol/L (ref 135–145)
TOTAL PROTEIN: 6.9 g/dL (ref 6.5–8.1)
Total Bilirubin: 0.5 mg/dL (ref 0.3–1.2)

## 2017-08-13 LAB — CBC
HCT: 39.2 % (ref 36.0–46.0)
HEMOGLOBIN: 13.1 g/dL (ref 12.0–15.0)
MCH: 31.8 pg (ref 26.0–34.0)
MCHC: 33.4 g/dL (ref 30.0–36.0)
MCV: 95.1 fL (ref 78.0–100.0)
PLATELETS: 209 10*3/uL (ref 150–400)
RBC: 4.12 MIL/uL (ref 3.87–5.11)
RDW: 12.8 % (ref 11.5–15.5)
WBC: 8.6 10*3/uL (ref 4.0–10.5)

## 2017-08-13 MED ORDER — ONDANSETRON HCL 4 MG/2ML IJ SOLN
4.0000 mg | Freq: Once | INTRAMUSCULAR | Status: AC
  Administered 2017-08-13: 4 mg via INTRAVENOUS
  Filled 2017-08-13: qty 2

## 2017-08-13 MED ORDER — SODIUM CHLORIDE 0.9 % IV SOLN: INTRAVENOUS | Status: DC

## 2017-08-13 MED ORDER — NAPROXEN 500 MG PO TABS: 500.0000 mg | ORAL_TABLET | Freq: Two times a day (BID) | ORAL | 0 refills | Status: DC

## 2017-08-13 MED ORDER — HYDROCODONE-ACETAMINOPHEN 5-325 MG PO TABS: 1.0000 | ORAL_TABLET | Freq: Four times a day (QID) | ORAL | 0 refills | Status: DC | PRN

## 2017-08-13 MED ORDER — IOPAMIDOL (ISOVUE-300) INJECTION 61%
INTRAVENOUS | Status: AC
  Filled 2017-08-13: qty 100

## 2017-08-13 MED ORDER — SODIUM CHLORIDE 0.9 % IV BOLUS (SEPSIS)
500.0000 mL | Freq: Once | INTRAVENOUS | Status: AC
  Administered 2017-08-13: 500 mL via INTRAVENOUS

## 2017-08-13 MED ORDER — FENTANYL CITRATE (PF) 100 MCG/2ML IJ SOLN
25.0000 ug | Freq: Once | INTRAMUSCULAR | Status: AC
Start: 2017-08-13 — End: 2017-08-13
  Administered 2017-08-13: 25 ug via INTRAVENOUS
  Filled 2017-08-13: qty 2

## 2017-08-13 NOTE — ED Notes (Signed)
Transported to xray 
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
## 2017-08-13 NOTE — ED Notes (Signed)
Returned from xray family at bedside.

## 2019-06-25 ENCOUNTER — Encounter (HOSPITAL_BASED_OUTPATIENT_CLINIC_OR_DEPARTMENT_OTHER): Payer: Self-pay | Admitting: Oncology

## 2019-06-25 ENCOUNTER — Other Ambulatory Visit: Payer: Self-pay

## 2019-06-25 ENCOUNTER — Emergency Department (HOSPITAL_BASED_OUTPATIENT_CLINIC_OR_DEPARTMENT_OTHER)
Admission: EM | Admit: 2019-06-25 | Discharge: 2019-06-25 | Disposition: A | Discharge status: Home / Self Care | Payer: Self-pay | Attending: Emergency Medicine | Admitting: Emergency Medicine

## 2019-06-25 DIAGNOSIS — F17210 Nicotine dependence, cigarettes, uncomplicated: Secondary | ICD-10-CM | POA: Insufficient documentation

## 2019-06-25 DIAGNOSIS — Y929 Unspecified place or not applicable: Secondary | ICD-10-CM | POA: Insufficient documentation

## 2019-06-25 DIAGNOSIS — Y999 Unspecified external cause status: Secondary | ICD-10-CM | POA: Insufficient documentation

## 2019-06-25 DIAGNOSIS — Z79899 Other long term (current) drug therapy: Secondary | ICD-10-CM | POA: Insufficient documentation

## 2019-06-25 DIAGNOSIS — Y939 Activity, unspecified: Secondary | ICD-10-CM | POA: Insufficient documentation

## 2019-06-25 DIAGNOSIS — I10 Essential (primary) hypertension: Secondary | ICD-10-CM | POA: Insufficient documentation

## 2019-06-25 DIAGNOSIS — X58XXXA Exposure to other specified factors, initial encounter: Secondary | ICD-10-CM | POA: Insufficient documentation

## 2019-06-25 DIAGNOSIS — S46811A Strain of other muscles, fascia and tendons at shoulder and upper arm level, right arm, initial encounter: Secondary | ICD-10-CM | POA: Insufficient documentation

## 2019-06-25 MED ORDER — ACETAMINOPHEN 500 MG PO TABS
1000.0000 mg | ORAL_TABLET | Freq: Once | ORAL | Status: AC
  Administered 2019-06-25: 1000 mg via ORAL
  Filled 2019-06-25: qty 2

## 2019-06-25 MED ORDER — IBUPROFEN 800 MG PO TABS
800.0000 mg | ORAL_TABLET | Freq: Once | ORAL | Status: AC
Start: 2019-06-25 — End: 2019-06-25
  Administered 2019-06-25: 02:00:00 800 mg via ORAL
  Filled 2019-06-25: qty 1
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
## 2019-06-25 NOTE — ED Triage Notes (Signed)
Pt c/o sharp right sided CP that radiates into right arm and neck.  Denies associated sx.  Did begin to exercise regiment x 2 days ago.

## 2019-08-31 ENCOUNTER — Other Ambulatory Visit: Payer: Self-pay

## 2019-08-31 ENCOUNTER — Emergency Department (HOSPITAL_BASED_OUTPATIENT_CLINIC_OR_DEPARTMENT_OTHER): Payer: Self-pay

## 2019-08-31 ENCOUNTER — Emergency Department (HOSPITAL_BASED_OUTPATIENT_CLINIC_OR_DEPARTMENT_OTHER)
Admission: EM | Admit: 2019-08-31 | Discharge: 2019-08-31 | Disposition: A | Discharge status: Home / Self Care | Payer: Self-pay | Attending: Emergency Medicine | Admitting: Emergency Medicine

## 2019-08-31 ENCOUNTER — Encounter (HOSPITAL_BASED_OUTPATIENT_CLINIC_OR_DEPARTMENT_OTHER): Payer: Self-pay

## 2019-08-31 DIAGNOSIS — M542 Cervicalgia: Secondary | ICD-10-CM | POA: Insufficient documentation

## 2019-08-31 DIAGNOSIS — R1013 Epigastric pain: Secondary | ICD-10-CM | POA: Insufficient documentation

## 2019-08-31 DIAGNOSIS — R42 Dizziness and giddiness: Secondary | ICD-10-CM | POA: Insufficient documentation

## 2019-08-31 DIAGNOSIS — Y9389 Activity, other specified: Secondary | ICD-10-CM | POA: Insufficient documentation

## 2019-08-31 DIAGNOSIS — M25532 Pain in left wrist: Secondary | ICD-10-CM | POA: Insufficient documentation

## 2019-08-31 DIAGNOSIS — Y92410 Unspecified street and highway as the place of occurrence of the external cause: Secondary | ICD-10-CM | POA: Insufficient documentation

## 2019-08-31 DIAGNOSIS — W2210XA Striking against or struck by unspecified automobile airbag, initial encounter: Secondary | ICD-10-CM | POA: Diagnosis not present

## 2019-08-31 DIAGNOSIS — I10 Essential (primary) hypertension: Secondary | ICD-10-CM | POA: Insufficient documentation

## 2019-08-31 DIAGNOSIS — M549 Dorsalgia, unspecified: Secondary | ICD-10-CM | POA: Insufficient documentation

## 2019-08-31 DIAGNOSIS — Y998 Other external cause status: Secondary | ICD-10-CM | POA: Diagnosis not present

## 2019-08-31 DIAGNOSIS — F17210 Nicotine dependence, cigarettes, uncomplicated: Secondary | ICD-10-CM | POA: Insufficient documentation

## 2019-08-31 DIAGNOSIS — M898X5 Other specified disorders of bone, thigh: Secondary | ICD-10-CM | POA: Insufficient documentation

## 2019-08-31 DIAGNOSIS — R519 Headache, unspecified: Secondary | ICD-10-CM | POA: Diagnosis present

## 2019-08-31 LAB — CBC WITH DIFFERENTIAL/PLATELET
Abs Immature Granulocytes: 0.02 10*3/uL (ref 0.00–0.07)
Basophils Absolute: 0 10*3/uL (ref 0.0–0.1)
Basophils Relative: 0 %
Eosinophils Absolute: 0.1 10*3/uL (ref 0.0–0.5)
Eosinophils Relative: 1 %
HCT: 42.4 % (ref 36.0–46.0)
Hemoglobin: 14 g/dL (ref 12.0–15.0)
Immature Granulocytes: 0 %
Lymphocytes Relative: 16 %
Lymphs Abs: 1.6 10*3/uL (ref 0.7–4.0)
MCH: 32.6 pg (ref 26.0–34.0)
MCHC: 33 g/dL (ref 30.0–36.0)
MCV: 98.6 fL (ref 80.0–100.0)
Monocytes Absolute: 0.8 10*3/uL (ref 0.1–1.0)
Monocytes Relative: 8 %
Neutro Abs: 7.7 10*3/uL (ref 1.7–7.7)
Neutrophils Relative %: 75 %
Platelets: 251 10*3/uL (ref 150–400)
RBC: 4.3 MIL/uL (ref 3.87–5.11)
RDW: 12.9 % (ref 11.5–15.5)
WBC: 10.2 10*3/uL (ref 4.0–10.5)
nRBC: 0 % (ref 0.0–0.2)

## 2019-08-31 LAB — COMPREHENSIVE METABOLIC PANEL
ALT: 9 U/L (ref 0–44)
AST: 23 U/L (ref 15–41)
Albumin: 4.5 g/dL (ref 3.5–5.0)
Alkaline Phosphatase: 55 U/L (ref 38–126)
Anion gap: 11 (ref 5–15)
BUN: 17 mg/dL (ref 6–20)
CO2: 22 mmol/L (ref 22–32)
Calcium: 9.2 mg/dL (ref 8.9–10.3)
Chloride: 104 mmol/L (ref 98–111)
Creatinine, Ser: 0.63 mg/dL (ref 0.44–1.00)
GFR calc Af Amer: >60 mL/min (ref >60)
GFR calc non Af Amer: >60 mL/min (ref >60)
Glucose, Bld: 76 mg/dL (ref 70–99)
Potassium: 3.7 mmol/L (ref 3.5–5.1)
Sodium: 137 mmol/L (ref 135–145)
Total Bilirubin: 0.6 mg/dL (ref 0.3–1.2)
Total Protein: 7.5 g/dL (ref 6.5–8.1)

## 2019-08-31 LAB — HCG, QUANTITATIVE, PREGNANCY: hCG, Beta Chain, Quant, S: <1 m[IU]/mL (ref <5)

## 2019-08-31 LAB — LIPASE, BLOOD: Lipase: 21 U/L (ref 11–51)

## 2019-08-31 MED ORDER — MORPHINE SULFATE (PF) 4 MG/ML IV SOLN
4.0000 mg | Freq: Once | INTRAVENOUS | Status: AC
  Administered 2019-08-31: 4 mg via INTRAVENOUS
  Filled 2019-08-31: qty 1

## 2019-08-31 MED ORDER — IOHEXOL 300 MG/ML  SOLN
100.0000 mL | Freq: Once | INTRAMUSCULAR | Status: AC | PRN
  Administered 2019-08-31: 100 mL via INTRAVENOUS

## 2019-08-31 MED ORDER — HYDROCODONE-ACETAMINOPHEN 5-325 MG PO TABS
1.0000 | ORAL_TABLET | Freq: Once | ORAL | Status: AC
  Administered 2019-08-31: 1 via ORAL
  Filled 2019-08-31: qty 1

## 2019-08-31 MED ORDER — HYDROCODONE-ACETAMINOPHEN 5-325 MG PO TABS: 1.0000 | ORAL_TABLET | Freq: Four times a day (QID) | ORAL | 0 refills | Status: DC | PRN
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
## 2019-08-31 NOTE — ED Notes (Signed)
CT awaiting pregnancy test and CMP prior to imaging per radiology protocol;

## 2019-08-31 NOTE — ED Notes (Signed)
ED Provider at bedside, pt placed in C-Collar
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
## 2019-09-03 ENCOUNTER — Encounter (HOSPITAL_BASED_OUTPATIENT_CLINIC_OR_DEPARTMENT_OTHER): Payer: Self-pay | Admitting: Emergency Medicine

## 2019-09-03 ENCOUNTER — Emergency Department (HOSPITAL_BASED_OUTPATIENT_CLINIC_OR_DEPARTMENT_OTHER)
Admission: EM | Admit: 2019-09-03 | Discharge: 2019-09-04 | Disposition: A | Discharge status: Home / Self Care | Payer: Self-pay | Attending: Emergency Medicine | Admitting: Emergency Medicine

## 2019-09-03 ENCOUNTER — Other Ambulatory Visit: Payer: Self-pay

## 2019-09-03 DIAGNOSIS — X19XXXD Contact with other heat and hot substances, subsequent encounter: Secondary | ICD-10-CM | POA: Diagnosis not present

## 2019-09-03 DIAGNOSIS — Y9389 Activity, other specified: Secondary | ICD-10-CM | POA: Insufficient documentation

## 2019-09-03 DIAGNOSIS — I10 Essential (primary) hypertension: Secondary | ICD-10-CM | POA: Diagnosis not present

## 2019-09-03 DIAGNOSIS — S46811A Strain of other muscles, fascia and tendons at shoulder and upper arm level, right arm, initial encounter: Secondary | ICD-10-CM

## 2019-09-03 DIAGNOSIS — Z79899 Other long term (current) drug therapy: Secondary | ICD-10-CM | POA: Insufficient documentation

## 2019-09-03 DIAGNOSIS — Y9289 Other specified places as the place of occurrence of the external cause: Secondary | ICD-10-CM | POA: Insufficient documentation

## 2019-09-03 DIAGNOSIS — T22032D Burn of unspecified degree of left upper arm, subsequent encounter: Secondary | ICD-10-CM | POA: Insufficient documentation

## 2019-09-03 DIAGNOSIS — S46811D Strain of other muscles, fascia and tendons at shoulder and upper arm level, right arm, subsequent encounter: Secondary | ICD-10-CM | POA: Insufficient documentation

## 2019-09-03 MED ORDER — OXYCODONE HCL 5 MG PO TABS: 5.0000 mg | ORAL_TABLET | Freq: Once | ORAL | Status: DC

## 2019-09-03 MED ORDER — KETOROLAC TROMETHAMINE 15 MG/ML IJ SOLN
15.0000 mg | Freq: Once | INTRAMUSCULAR | Status: AC
  Administered 2019-09-03: 15 mg via INTRAMUSCULAR
  Filled 2019-09-03: qty 1

## 2019-09-03 MED ORDER — OXYCODONE HCL 5 MG PO TABS
5.0000 mg | ORAL_TABLET | Freq: Once | ORAL | Status: AC
  Administered 2019-09-04: 5 mg via ORAL
  Filled 2019-09-03: qty 1

## 2019-09-03 MED ORDER — SILVER SULFADIAZINE 1 % EX CREA
TOPICAL_CREAM | Freq: Once | CUTANEOUS | Status: AC
  Administered 2019-09-03: 1 via TOPICAL
  Filled 2019-09-03: qty 85

## 2019-09-03 MED ORDER — ACETAMINOPHEN 500 MG PO TABS
1000.0000 mg | ORAL_TABLET | Freq: Once | ORAL | Status: AC
  Administered 2019-09-03: 1000 mg via ORAL
  Filled 2019-09-03: qty 2
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
## 2019-09-03 NOTE — Discharge Instructions (Signed)
Take 4 over the counter ibuprofen tablets 3 times a day or 2 over-the-counter naproxen tablets twice a day for pain. Also take tylenol 1000mg (2 extra strength) four times a day.    Please follow-up with your family physician for this.  I have also included the information for the hand surgeon that is on-call.  If you continue to have pain in your wrist then this might be something that he would be interested in seeing you for.  Please return for rapid spreading redness drainage or fever.  Apply the cream to the area twice a day.
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
## 2019-09-17 ENCOUNTER — Other Ambulatory Visit: Payer: Self-pay

## 2019-09-17 ENCOUNTER — Ambulatory Visit: Payer: Self-pay | Attending: Family Medicine | Admitting: Physician Assistant

## 2019-09-17 ENCOUNTER — Encounter: Payer: Self-pay | Admitting: Physician Assistant

## 2019-09-17 VITALS — BP 145/111 | HR 105 | Temp 98.4°F | Resp 16 | Wt 147.6 lb

## 2019-09-17 DIAGNOSIS — I10 Essential (primary) hypertension: Secondary | ICD-10-CM

## 2019-09-17 DIAGNOSIS — M542 Cervicalgia: Secondary | ICD-10-CM

## 2019-09-17 DIAGNOSIS — R202 Paresthesia of skin: Secondary | ICD-10-CM

## 2019-09-17 DIAGNOSIS — T300 Burn of unspecified body region, unspecified degree: Secondary | ICD-10-CM

## 2019-09-17 DIAGNOSIS — Z09 Encounter for follow-up examination after completed treatment for conditions other than malignant neoplasm: Secondary | ICD-10-CM

## 2019-09-17 MED ORDER — HYDROCHLOROTHIAZIDE 25 MG PO TABS: 25.0000 mg | ORAL_TABLET | Freq: Every day | ORAL | 3 refills | Status: DC

## 2019-09-17 MED ORDER — METHOCARBAMOL 500 MG PO TABS: 500.0000 mg | ORAL_TABLET | Freq: Four times a day (QID) | ORAL | 0 refills | Status: DC

## 2019-09-17 MED ORDER — NAPROXEN 500 MG PO TABS: 500.0000 mg | ORAL_TABLET | Freq: Two times a day (BID) | ORAL | 0 refills | Status: DC

## 2019-09-17 MED FILL — NAPROXEN 500 MG TABLET: 500 | 30 days supply | Qty: 60 | Fill #0

## 2019-09-17 MED FILL — METHOCARBAMOL 500 MG TABS: 500 | 22 days supply | Qty: 90 | Fill #0

## 2019-09-17 NOTE — Patient Instructions (Signed)
Muscle Cramps and Spasms Muscle cramps and spasms are when muscles tighten by themselves. They usually get better within minutes. Muscle cramps are painful. They are usually stronger and last longer than muscle spasms. Muscle spasms may or may not be painful. They can last a few seconds or much longer. Cramps and spasms can affect any muscle, but they occur most often in the calf muscles of the leg. They are usually not caused by a serious problem. In many cases, the cause is not known. Some common causes include:  Doing more physical work or exercise than your body is ready for.  Using the muscles too much (overuse) by repeating certain movements too many times.  Staying in a certain position for a long time.  Playing a sport or doing an activity without preparing properly.  Using bad form or technique while playing a sport or doing an activity.  Not having enough water in your body (dehydration).  Injury.  Side effects of some medicines.  Low levels of the salts and minerals in your blood (electrolytes), such as low potassium or calcium. Follow these instructions at home: Managing pain and stiffness      Massage, stretch, and relax the muscle. Do this for many minutes at a time.  If told, put heat on tight or tense muscles as often as told by your doctor. Use the heat source that your doctor recommends, such as a moist heat pack or a heating pad. ? Place a towel between your skin and the heat source. ? Leave the heat on for 20-30 minutes. ? Remove the heat if your skin turns bright red. This is very important if you are not able to feel pain, heat, or cold. You may have a greater risk of getting burned.  If told, put ice on the affected area. This may help if you are sore or have pain after a cramp or spasm. ? Put ice in a plastic bag. ? Place a towel between your skin and the bag. ? Leave the ice on for 20 minutes, 2-3 times a day.  Try taking hot showers or baths to help  relax tight muscles. Eating and drinking  Drink enough fluid to keep your pee (urine) pale yellow.  Eat a healthy diet to help ensure that your muscles work well. This should include: ? Fruits and vegetables. ? Lean protein. ? Whole grains. ? Low-fat or nonfat dairy products. General instructions  If you are having cramps often, avoid intense exercise for several days.  Take over-the-counter and prescription medicines only as told by your doctor.  Watch for any changes in your symptoms.  Keep all follow-up visits as told by your doctor. This is important. Contact a doctor if:  Your cramps or spasms get worse or happen more often.  Your cramps or spasms do not get better with time. Summary  Muscle cramps and spasms are when muscles tighten by themselves. They usually get better within minutes.  Cramps and spasms occur most often in the calf muscles of the leg.  Massage, stretch, and relax the muscle. This may help the cramp or spasm go away.  Drink enough fluid to keep your pee (urine) pale yellow. This information is not intended to replace advice given to you by your health care provider. Make sure you discuss any questions you have with your health care provider. Document Revised: 10/22/2017 Document Reviewed: 10/22/2017 Elsevier Patient Education  2020 [employer]  

## 2019-09-30 ENCOUNTER — Telehealth: Payer: Self-pay | Admitting: General Practice

## 2019-09-30 NOTE — Telephone Encounter (Signed)
1) Medication(s) Requested (by name):sodium chloride 0.9 % bolus 500 mL  [[id number]]    2) Pharmacy of Choice:[id number] - [place], [place] - [address] AT [facility] [address]  [address], [place]   3) Special Requests:   Approved medications will be sent to the pharmacy, we will reach out if there is an issue.  Requests made after 3pm may not be addressed until the following business day!  If a patient is unsure of the name of the medication(s) please note and ask patient to call back when they are able to provide all info, do not send to responsible party until all information is available!

## 2019-10-01 ENCOUNTER — Other Ambulatory Visit: Payer: Self-pay | Admitting: Physician Assistant

## 2019-10-01 MED ORDER — SILVER SULFADIAZINE 1 % EX CREA: 1.0000 "application " | TOPICAL_CREAM | Freq: Every day | CUTANEOUS | 0 refills | Status: DC

## 2019-10-01 NOTE — Telephone Encounter (Signed)
This medication is not outpatient.

## 2019-10-10 ENCOUNTER — Telehealth: Payer: Self-pay | Admitting: General Practice

## 2019-10-30 ENCOUNTER — Other Ambulatory Visit: Payer: Self-pay

## 2019-10-30 ENCOUNTER — Ambulatory Visit: Payer: Self-pay | Attending: Internal Medicine | Admitting: Internal Medicine
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
## 2022-01-13 ENCOUNTER — Emergency Department (HOSPITAL_BASED_OUTPATIENT_CLINIC_OR_DEPARTMENT_OTHER)
Admission: EM | Admit: 2022-01-13 | Discharge: 2022-01-14 | Disposition: A | Discharge status: Home / Self Care | Payer: Self-pay | Attending: Emergency Medicine | Admitting: Emergency Medicine

## 2022-01-13 ENCOUNTER — Other Ambulatory Visit: Payer: Self-pay

## 2022-01-13 ENCOUNTER — Emergency Department (HOSPITAL_BASED_OUTPATIENT_CLINIC_OR_DEPARTMENT_OTHER): Payer: Self-pay

## 2022-01-13 ENCOUNTER — Encounter (HOSPITAL_BASED_OUTPATIENT_CLINIC_OR_DEPARTMENT_OTHER): Payer: Self-pay | Admitting: Emergency Medicine

## 2022-01-13 DIAGNOSIS — I10 Essential (primary) hypertension: Secondary | ICD-10-CM | POA: Insufficient documentation

## 2022-01-13 DIAGNOSIS — Z20822 Contact with and (suspected) exposure to covid-19: Secondary | ICD-10-CM | POA: Insufficient documentation

## 2022-01-13 DIAGNOSIS — F17210 Nicotine dependence, cigarettes, uncomplicated: Secondary | ICD-10-CM | POA: Insufficient documentation

## 2022-01-13 DIAGNOSIS — J069 Acute upper respiratory infection, unspecified: Secondary | ICD-10-CM | POA: Insufficient documentation

## 2022-01-13 DIAGNOSIS — R0789 Other chest pain: Secondary | ICD-10-CM | POA: Insufficient documentation

## 2022-01-13 DIAGNOSIS — B001 Herpesviral vesicular dermatitis: Secondary | ICD-10-CM | POA: Insufficient documentation

## 2022-01-13 DIAGNOSIS — B309 Viral conjunctivitis, unspecified: Secondary | ICD-10-CM | POA: Insufficient documentation

## 2022-01-13 LAB — SARS CORONAVIRUS 2 BY RT PCR: SARS Coronavirus 2 by RT PCR: NEGATIVE

## 2022-01-13 LAB — GROUP A STREP BY PCR: Group A Strep by PCR: NOT DETECTED

## 2022-01-13 LAB — PREGNANCY, URINE: Preg Test, Ur: NEGATIVE

## 2022-01-13 NOTE — ED Triage Notes (Signed)
Pt having cold symptoms for three days.  Pt is coughing up phlegm, yellow to green.  Pt also has right eye swollen, with drainage.  Pt states she has been having some ear pain and rib pain with coughing.

## 2022-01-14 MED ORDER — NAPROXEN 250 MG PO TABS
500.0000 mg | ORAL_TABLET | Freq: Once | ORAL | Status: AC
  Administered 2022-01-14: 500 mg via ORAL
  Filled 2022-01-14: qty 2

## 2022-01-14 MED ORDER — NAPROXEN 375 MG PO TABS: ORAL_TABLET | ORAL | 0 refills | Status: DC

## 2022-01-14 MED ORDER — VALACYCLOVIR HCL 1 G PO TABS: ORAL_TABLET | ORAL | 0 refills | Status: DC

## 2022-01-14 MED ORDER — ERYTHROMYCIN 5 MG/GM OP OINT
TOPICAL_OINTMENT | Freq: Four times a day (QID) | OPHTHALMIC | Status: DC
  Filled 2022-01-14: qty 3.5

## 2022-01-14 MED ORDER — VALACYCLOVIR HCL 500 MG PO TABS
1000.0000 mg | ORAL_TABLET | Freq: Once | ORAL | Status: AC
  Administered 2022-01-14: 1000 mg via ORAL
  Filled 2022-01-14: qty 2

## 2022-01-14 NOTE — ED Provider Notes (Signed)
[facility] Provider Note: Alexa Dell, MD, FACEP  CSN: [id number] MRN: [id number] ARRIVAL: 01/13/22 at 2037 ROOM: MH08/[facility]08   CHIEF COMPLAINT  URI   HISTORY OF PRESENT ILLNESS  01/14/22 12:24 AM Alexa Hodge is a 36 y.o. female with cold symptoms for 3-4 days.  Specifically she has had a sore throat, a cough productive of yellow-green sputum, swelling of her right eye with exudate and conjunctival erythema, and a painful lesion of the right naris.  She is having rib pain with coughing.  She rates her rib pain as a 10 out of 10.    Past Medical History:  Diagnosis Date   Hypertension     Past Surgical History:  Procedure Laterality Date   CESAREAN SECTION     TUBAL LIGATION  2007    History reviewed. No pertinent family history.  Social History   Tobacco Use   Smoking status: Every Day    Packs/day: 0.50    Types: Cigarettes   Smokeless tobacco: Never  Vaping Use   Vaping Use: Never used  Substance Use Topics   Alcohol use: Yes    Comment: several times per yr   Drug use: No    Prior to Admission medications   Medication Sig Start Date End Date Taking? Authorizing Provider  naproxen (NAPROSYN) 375 MG tablet Take 1 tablet twice daily as needed for rib pain. 01/14/22  Yes Zelma Mazariego, MD  valACYclovir (VALTREX) 1000 MG tablet Take at noon today, 01/14/2022. 01/14/22  Yes Tanaya Dunigan, MD  hydrochlorothiazide (HYDRODIURIL) 25 MG tablet Take 1 tablet (25 mg total) by mouth daily. 09/17/19   Anders Simmonds, PA-C    Allergies Patient has no known allergies.   REVIEW OF SYSTEMS  Negative except as noted here or in the History of Present Illness.   PHYSICAL EXAMINATION  Initial Vital Signs Blood pressure (!) 138/93, pulse 93, temperature 98.5 F (36.9 C), temperature source Oral, resp. rate 18, height 5\' 1"  (1.549 m), weight 70.8 kg, last menstrual period 11/16/2021, SpO2 100 %.  Examination General: Well-developed, well-nourished female in no  acute distress; appearance consistent with age of record HENT: normocephalic; atraumatic; TMs normal; pharynx normal; crusted, vesicular lesion of right naris:    Eyes: pupils equal, round and reactive to light; extraocular muscles intact; right conjunctival injection with lid edema and serous exudate Neck: supple Heart: regular rate and rhythm Lungs: clear to auscultation bilaterally Abdomen: soft; nondistended; nontender; bowel sounds present Extremities: No deformity; full range of motion; pulses normal Neurologic: Awake, alert and oriented; motor function intact in all extremities and symmetric; no facial droop Skin: Warm and dry Psychiatric: Normal mood and affect   RESULTS  Summary of this visit's results, reviewed and interpreted by myself:   EKG Interpretation  Date/Time:    Ventricular Rate:    PR Interval:    QRS Duration:   QT Interval:    QTC Calculation:   R Axis:     Text Interpretation:         Laboratory Studies: Results for orders placed or performed during the hospital encounter of 01/13/22 (from the past 24 hour(s))  Group A Strep by PCR     Status: None   Collection Time: 01/13/22  9:15 PM   Specimen: Throat; Sterile Swab  Result Value Ref Range   Group A Strep by PCR NOT DETECTED NOT DETECTED  SARS Coronavirus 2 by RT PCR (hospital order, performed in [facility] hospital lab) *cepheid single result test*  Throat     Status: None   Collection Time: 01/13/22  9:15 PM   Specimen: Throat; Nasal Swab  Result Value Ref Range   SARS Coronavirus 2 by RT PCR NEGATIVE NEGATIVE  Pregnancy, urine     Status: None   Collection Time: 01/13/22  9:17 PM  Result Value Ref Range   Preg Test, Ur NEGATIVE NEGATIVE   Imaging Studies: DG Chest 2 View  Result Date: 01/13/2022 CLINICAL DATA:  Rib pain EXAM: CHEST - 2 VIEW COMPARISON:  CT chest 08/31/2019 FINDINGS: No focal consolidation, pleural effusion, or pneumothorax. Normal cardiomediastinal silhouette. No acute  osseous abnormality. IMPRESSION: No active cardiopulmonary disease. Electronically Signed   By: Minerva Fester M.D.   On: 01/13/2022 21:40    ED COURSE and MDM  Nursing notes, initial and subsequent vitals signs, including pulse oximetry, reviewed and interpreted by myself.  Vitals:   01/13/22 2109 01/13/22 2112 01/13/22 2121 01/14/22 0016  BP:  (!) 138/93  (!) 138/93  Pulse:  93  93  Resp:  16  18  Temp:  98.5 F (36.9 C)    TempSrc:  Oral    SpO2:  100%  100%  Weight:   70.8 kg   Height: 5\' 1"  (1.549 m)  5\' 1"  (1.549 m)    Medications  naproxen (NAPROSYN) tablet 500 mg (has no administration in time range)  valACYclovir (VALTREX) tablet 1,000 mg (has no administration in time range)  erythromycin ophthalmic ointment (has no administration in time range)    The patient's presentation is consistent with a viral illness.  I believe the crusted lesion of her right naris is consistent with a cold sore.  Although it is been present for at least 2 days we will go ahead and treat with Valtrex.  We will treat her right conjunctivitis with erythromycin ointment; it is likely viral but the ointment will help prevent a secondary bacterial infection.  We will treat her chest discomfort with naproxen.  PROCEDURES  Procedures   ED DIAGNOSES     ICD-10-CM   1. Viral URI with cough  J06.9     2. Chest wall pain  R07.89     3. Viral conjunctivitis of right eye  B30.9     4. Cold sore  B00.1          Briann Sarchet, MD 01/14/22 [phone]

## 2022-08-14 ENCOUNTER — Emergency Department (HOSPITAL_BASED_OUTPATIENT_CLINIC_OR_DEPARTMENT_OTHER)
Admission: EM | Admit: 2022-08-14 | Discharge: 2022-08-15 | Disposition: A | Discharge status: Home / Self Care | Payer: Self-pay | Attending: Emergency Medicine | Admitting: Emergency Medicine

## 2022-08-14 ENCOUNTER — Encounter (HOSPITAL_BASED_OUTPATIENT_CLINIC_OR_DEPARTMENT_OTHER): Payer: Self-pay

## 2022-08-14 ENCOUNTER — Other Ambulatory Visit: Payer: Self-pay

## 2022-08-14 DIAGNOSIS — I10 Essential (primary) hypertension: Secondary | ICD-10-CM | POA: Insufficient documentation

## 2022-08-14 LAB — URINALYSIS, MICROSCOPIC (REFLEX): WBC, UA: NONE SEEN WBC/hpf (ref 0–5)

## 2022-08-14 LAB — BASIC METABOLIC PANEL
Anion gap: 5 (ref 5–15)
BUN: 15 mg/dL (ref 6–20)
CO2: 26 mmol/L (ref 22–32)
Calcium: 9.1 mg/dL (ref 8.9–10.3)
Chloride: 105 mmol/L (ref 98–111)
Creatinine, Ser: 0.72 mg/dL (ref 0.44–1.00)
GFR, Estimated: >60 mL/min (ref >60)
Glucose, Bld: 100 mg/dL — ABNORMAL HIGH (ref 70–99)
Potassium: 3.4 mmol/L — ABNORMAL LOW (ref 3.5–5.1)
Sodium: 136 mmol/L (ref 135–145)

## 2022-08-14 LAB — URINALYSIS, ROUTINE W REFLEX MICROSCOPIC
Bilirubin Urine: NEGATIVE
Glucose, UA: NEGATIVE mg/dL
Ketones, ur: NEGATIVE mg/dL
Leukocytes,Ua: NEGATIVE
Nitrite: NEGATIVE
Protein, ur: NEGATIVE mg/dL
Specific Gravity, Urine: 1.015 (ref 1.005–1.030)
pH: 7 (ref 5.0–8.0)

## 2022-08-14 LAB — CBC
HCT: 41.2 % (ref 36.0–46.0)
Hemoglobin: 13.7 g/dL (ref 12.0–15.0)
MCH: 32 pg (ref 26.0–34.0)
MCHC: 33.3 g/dL (ref 30.0–36.0)
MCV: 96.3 fL (ref 80.0–100.0)
Platelets: 223 10*3/uL (ref 150–400)
RBC: 4.28 MIL/uL (ref 3.87–5.11)
RDW: 12.6 % (ref 11.5–15.5)
WBC: 8 10*3/uL (ref 4.0–10.5)
nRBC: 0 % (ref 0.0–0.2)

## 2022-08-14 LAB — CBG MONITORING, ED: Glucose-Capillary: 97 mg/dL (ref 70–99)

## 2022-08-14 LAB — PREGNANCY, URINE: Preg Test, Ur: NEGATIVE

## 2022-08-14 MED ORDER — HYDROCHLOROTHIAZIDE 25 MG PO TABS
25.0000 mg | ORAL_TABLET | Freq: Once | ORAL | Status: AC
  Administered 2022-08-15: 25 mg via ORAL
  Filled 2022-08-14: qty 1

## 2022-08-14 MED ORDER — LORAZEPAM 1 MG PO TABS
1.0000 mg | ORAL_TABLET | Freq: Once | ORAL | Status: AC
  Administered 2022-08-15: 1 mg via ORAL
  Filled 2022-08-14: qty 1
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
## 2022-08-15 MED ORDER — LORAZEPAM 1 MG PO TABS: 1.0000 mg | ORAL_TABLET | Freq: Three times a day (TID) | ORAL | 0 refills | Status: AC | PRN

## 2022-08-15 MED ORDER — HYDROCHLOROTHIAZIDE 25 MG PO TABS: 25.0000 mg | ORAL_TABLET | Freq: Every day | ORAL | 1 refills | Status: AC
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
## 2022-12-12 ENCOUNTER — Emergency Department (HOSPITAL_BASED_OUTPATIENT_CLINIC_OR_DEPARTMENT_OTHER)
Admission: EM | Admit: 2022-12-12 | Discharge: 2022-12-12 | Disposition: A | Discharge status: Home / Self Care | Payer: Self-pay | Attending: Emergency Medicine | Admitting: Emergency Medicine

## 2022-12-12 ENCOUNTER — Encounter (HOSPITAL_BASED_OUTPATIENT_CLINIC_OR_DEPARTMENT_OTHER): Payer: Self-pay | Admitting: Emergency Medicine

## 2022-12-12 DIAGNOSIS — I10 Essential (primary) hypertension: Secondary | ICD-10-CM | POA: Insufficient documentation

## 2022-12-12 DIAGNOSIS — K029 Dental caries, unspecified: Secondary | ICD-10-CM | POA: Insufficient documentation

## 2022-12-12 DIAGNOSIS — Z79899 Other long term (current) drug therapy: Secondary | ICD-10-CM | POA: Insufficient documentation

## 2022-12-12 MED ORDER — IBUPROFEN 600 MG PO TABS: 600.0000 mg | ORAL_TABLET | Freq: Four times a day (QID) | ORAL | 0 refills | Status: DC | PRN

## 2022-12-12 MED ORDER — PENICILLIN V POTASSIUM 500 MG PO TABS: 500.0000 mg | ORAL_TABLET | Freq: Three times a day (TID) | ORAL | 0 refills | Status: DC

## 2022-12-12 NOTE — ED Provider Notes (Signed)
[place] [facility] Provider Note   CSN: [id number] Arrival date & time: 12/12/22  1939     History  Chief Complaint  Patient presents with   Dental Pain    Alexa Hodge is a 37 y.o. female.  The history is provided by the patient. No language interpreter was used.  Dental Pain    37 year old female significant of hypertension presenting complaint of dental pain.  Patient report for the past week she has had progressive worsening pain to her left lower tooth radiates to her ear and jaw.  Pain described as a sharp throbbing aching sensation worse with chewing and with temperature changes.  She tries taking some Tylenol with some relief.  She denies any fever, trouble swallowing, loss of hearing or ringing in her ear.  She denies any trauma.  She does not have a dentist.  She did recall chipping her tooth recently.  Home Medications Prior to Admission medications   Medication Sig Start Date End Date Taking? Authorizing Provider  hydrochlorothiazide (HYDRODIURIL) 25 MG tablet Take 1 tablet (25 mg total) by mouth daily. 08/15/22   Mesner, Barbara Cower, MD  LORazepam (ATIVAN) 1 MG tablet Take 1 tablet (1 mg total) by mouth 3 (three) times daily as needed for anxiety. 08/15/22   Mesner, Barbara Cower, MD  naproxen (NAPROSYN) 375 MG tablet Take 1 tablet twice daily as needed for rib pain. 01/14/22   Molpus, John, MD  valACYclovir (VALTREX) 1000 MG tablet Take at noon today, 01/14/2022. 01/14/22   Molpus, Jonny Ruiz, MD      Allergies    Patient has no known allergies.    Review of Systems   Review of Systems  All other systems reviewed and are negative.   Physical Exam Updated Vital Signs BP (!) 160/114 (BP Location: Right Arm)   Pulse 76   Temp 98.9 F (37.2 C) (Oral)   Resp 17   Ht 5\' 1"  (1.549 m)   LMP 12/05/2022 (Approximate)   SpO2 100%   BMI 26.81 kg/m  Physical Exam Vitals and nursing note reviewed.  Constitutional:      General: She is not in acute  distress.    Appearance: She is well-developed.  HENT:     Head: Atraumatic.     Right Ear: Tympanic membrane normal.     Left Ear: Tympanic membrane normal.     Mouth/Throat:     Comments: Moderate dental decay noted to tooth #17 with tenderness to palpation.  No gingival erythema no obvious abscess noted.  No facial involvement. Eyes:     Conjunctiva/sclera: Conjunctivae normal.  Pulmonary:     Effort: Pulmonary effort is normal.  Musculoskeletal:     Cervical back: Normal range of motion and neck supple. No rigidity or tenderness.  Lymphadenopathy:     Cervical: No cervical adenopathy.  Skin:    Findings: No rash.  Neurological:     Mental Status: She is alert.  Psychiatric:        Mood and Affect: Mood normal.     ED Results / Procedures / Treatments   Labs (all labs ordered are listed, but only abnormal results are displayed) Labs Reviewed - No data to display  EKG None  Radiology No results found.  Procedures Procedures    Medications Ordered in ED Medications - No data to display  ED Course/ Medical Decision Making/ A&P  Medical Decision Making  BP (!) 160/114 (BP Location: Right Arm)   Pulse 76   Temp 98.9 F (37.2 C) (Oral)   Resp 17   Ht 5\' 1"  (1.549 m)   LMP 12/05/2022 (Approximate)   SpO2 100%   BMI 26.81 kg/m   58:69 PM  37 year old female significant of hypertension presenting complaint of dental pain.  Patient report for the past week she has had progressive worsening pain to her left lower tooth radiates to her ear and jaw.  Pain described as a sharp throbbing aching sensation worse with chewing and with temperature changes.  She tries taking some Tylenol with some relief.  She denies any fever, trouble swallowing, loss of hearing or ringing in her ear.  She denies any trauma.  She does not have a dentist.  She did recall chipping her tooth recently.  On exam this is a well-appearing female resting comfortably in  bed appears to be in no acute discomfort.  Ear exam unremarkable no evidence of ear infection mouth exam remarkable for dental decay noted to tooth #17 with tenderness to palpation but no abscess noted.  Her pain is likely due to a dental infection.  Vital signs notable for elevated blood pressure of 160/114.  This is likely in the setting of pain.  DDx includes otitis media, dental abscess, dental decayed, pulpitis, mastoiditis, PTA.  Plan to treat patient with antibiotic and anti-inflammatory medication and have patient follow-up outpatient with dentist for further care.  Advanced imaging including CT scan of the maxillofacial was considered but not performed as I have low suspicion for systemic infection and patient overall well-appearing.  Social determinant of health including tobacco use.  Patient reports she is currently not pregnant.        Final Clinical Impression(s) / ED Diagnoses Final diagnoses:  Pain due to dental caries    Rx / DC Orders ED Discharge Orders          Ordered    penicillin v potassium (VEETID) 500 MG tablet  3 times daily        12/12/22 2048    ibuprofen (ADVIL) 600 MG tablet  Every 6 hours PRN        12/12/22 2048              Fayrene Helper, PA-C 12/12/22 2049    Cathren Laine, MD 12/13/22 1520

## 2022-12-12 NOTE — ED Notes (Signed)
D/c paperwork reviewed with pt, including prescriptions and follow up care.  No questions or concerns voiced at time of d/c. . Pt verbalized understanding, Ambulatory without assistance to ED exit, NAD.   

## 2022-12-12 NOTE — ED Triage Notes (Signed)
Left lower dental pain that radiates into left ear and down into left throat x 3 days. Took tylenol pta with some relief.

## 2022-12-12 NOTE — Discharge Instructions (Signed)
Your pain is likely due to a dental infection.  Please take antibiotic as prescribed.  Take ibuprofen as needed for pain control.  Follow-up with a dentist for outpatient management of your condition.

## 2022-12-17 ENCOUNTER — Other Ambulatory Visit: Payer: Self-pay

## 2022-12-17 ENCOUNTER — Emergency Department (HOSPITAL_BASED_OUTPATIENT_CLINIC_OR_DEPARTMENT_OTHER)
Admission: EM | Admit: 2022-12-17 | Discharge: 2022-12-18 | Disposition: A | Discharge status: Home / Self Care | Payer: Self-pay | Attending: Emergency Medicine | Admitting: Emergency Medicine

## 2022-12-17 ENCOUNTER — Encounter (HOSPITAL_BASED_OUTPATIENT_CLINIC_OR_DEPARTMENT_OTHER): Payer: Self-pay | Admitting: Emergency Medicine

## 2022-12-17 DIAGNOSIS — Z20822 Contact with and (suspected) exposure to covid-19: Secondary | ICD-10-CM | POA: Insufficient documentation

## 2022-12-17 DIAGNOSIS — J029 Acute pharyngitis, unspecified: Secondary | ICD-10-CM | POA: Insufficient documentation

## 2022-12-17 DIAGNOSIS — Z79899 Other long term (current) drug therapy: Secondary | ICD-10-CM | POA: Insufficient documentation

## 2022-12-17 NOTE — ED Triage Notes (Signed)
Exposed to covid 2 days ago. Sore throat, cough, and chills started yesterday.

## 2022-12-18 LAB — RESP PANEL BY RT-PCR (RSV, FLU A&B, COVID)  RVPGX2
Influenza A by PCR: NEGATIVE
Influenza B by PCR: NEGATIVE
Resp Syncytial Virus by PCR: NEGATIVE
SARS Coronavirus 2 by RT PCR: NEGATIVE

## 2022-12-18 LAB — GROUP A STREP BY PCR: Group A Strep by PCR: NOT DETECTED

## 2022-12-18 NOTE — Discharge Instructions (Signed)
Your COVID and STREP test are both NEGATIVE

## 2022-12-18 NOTE — ED Provider Notes (Signed)
  [place] [facility] Provider Note   CSN: [id number] Arrival date & time: 12/17/22  2349     History  Chief Complaint  Patient presents with   Covid Exposure    Alexa Hodge is a 37 y.o. female.  The history is provided by the patient.   Patient reports COVID exposure 2 days ago she reports sore throat, cough and chills.  No vomiting or diarrhea.  No chest pain or shortness of breath    Home Medications Prior to Admission medications   Medication Sig Start Date End Date Taking? Authorizing Provider  hydrochlorothiazide (HYDRODIURIL) 25 MG tablet Take 1 tablet (25 mg total) by mouth daily. 08/15/22   Mesner, Barbara Cower, MD  ibuprofen (ADVIL) 600 MG tablet Take 1 tablet (600 mg total) by mouth every 6 (six) hours as needed. 12/12/22   Fayrene Helper, PA-C  LORazepam (ATIVAN) 1 MG tablet Take 1 tablet (1 mg total) by mouth 3 (three) times daily as needed for anxiety. 08/15/22   Mesner, Barbara Cower, MD  naproxen (NAPROSYN) 375 MG tablet Take 1 tablet twice daily as needed for rib pain. 01/14/22   Molpus, John, MD  penicillin v potassium (VEETID) 500 MG tablet Take 1 tablet (500 mg total) by mouth 3 (three) times daily. 12/12/22   Fayrene Helper, PA-C  valACYclovir Ralph Dowdy) 1000 MG tablet Take at noon today, 01/14/2022. 01/14/22   Molpus, Jonny Ruiz, MD      Allergies    Patient has no known allergies.    Review of Systems   Review of Systems  Physical Exam Updated Vital Signs BP (!) 152/104 (BP Location: Right Arm)   Pulse (!) 108   Temp 98.4 F (36.9 C) (Oral)   Resp 16   Ht 1.549 m (5\' 1" )   LMP 12/05/2022 (Approximate)   SpO2 98%   BMI 26.81 kg/m  Physical Exam CONSTITUTIONAL: Well developed/well nourished HEAD: Normocephalic/atraumatic EYES: EOMI/PERRL ENMT: Mucous membranes moist, uvula midline without erythema or exudates.  No stridor or drooling NECK: supple no meningeal signs CV: S1/S2 noted, no murmurs/rubs/gallops noted LUNGS: Lungs are clear to  auscultation bilaterally, no apparent distress ABDOMEN: soft NEURO: Pt is awake/alert/appropriate, moves all extremitiesx4.   SKIN: warm, color normal PSYCH: no abnormalities of mood noted, alert and oriented to situation  ED Results / Procedures / Treatments   Labs (all labs ordered are listed, but only abnormal results are displayed) Labs Reviewed  RESP PANEL BY RT-PCR (RSV, FLU A&B, COVID)  [id number]  GROUP A STREP BY PCR    EKG None  Radiology No results found.  Procedures Procedures    Medications Ordered in ED Medications - No data to display  ED Course/ Medical Decision Making/ A&P                             Medical Decision Making  Viral panel and strep panel negative.  Patient is well-appearing.  Plan for discharge home        Final Clinical Impression(s) / ED Diagnoses Final diagnoses:  Pharyngitis, unspecified etiology    Rx / DC Orders ED Discharge Orders     None         Zadie Rhine, MD 12/18/22 [phone]

## 2023-03-24 ENCOUNTER — Encounter (HOSPITAL_BASED_OUTPATIENT_CLINIC_OR_DEPARTMENT_OTHER): Payer: Self-pay

## 2023-03-24 ENCOUNTER — Emergency Department (HOSPITAL_BASED_OUTPATIENT_CLINIC_OR_DEPARTMENT_OTHER)
Admission: EM | Admit: 2023-03-24 | Discharge: 2023-03-25 | Disposition: A | Discharge status: Home / Self Care | Payer: Self-pay | Attending: Emergency Medicine | Admitting: Emergency Medicine

## 2023-03-24 ENCOUNTER — Other Ambulatory Visit: Payer: Self-pay

## 2023-03-24 DIAGNOSIS — S161XXA Strain of muscle, fascia and tendon at neck level, initial encounter: Secondary | ICD-10-CM | POA: Insufficient documentation

## 2023-03-24 DIAGNOSIS — S0990XA Unspecified injury of head, initial encounter: Secondary | ICD-10-CM | POA: Insufficient documentation

## 2023-03-24 DIAGNOSIS — Y92410 Unspecified street and highway as the place of occurrence of the external cause: Secondary | ICD-10-CM | POA: Insufficient documentation

## 2023-03-24 DIAGNOSIS — Z79899 Other long term (current) drug therapy: Secondary | ICD-10-CM | POA: Insufficient documentation

## 2023-03-24 DIAGNOSIS — S199XXA Unspecified injury of neck, initial encounter: Secondary | ICD-10-CM | POA: Diagnosis present

## 2023-03-24 MED ORDER — IBUPROFEN 400 MG PO TABS
400.0000 mg | ORAL_TABLET | Freq: Once | ORAL | Status: AC | PRN
  Administered 2023-03-24: 400 mg via ORAL
  Filled 2023-03-24: qty 1
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
## 2023-03-25 ENCOUNTER — Emergency Department (HOSPITAL_BASED_OUTPATIENT_CLINIC_OR_DEPARTMENT_OTHER): Payer: Self-pay

## 2023-03-25 MED ORDER — METHOCARBAMOL 500 MG PO TABS
500.0000 mg | ORAL_TABLET | Freq: Once | ORAL | Status: AC
  Administered 2023-03-25: 500 mg via ORAL
  Filled 2023-03-25: qty 1

## 2023-03-25 MED ORDER — ACETAMINOPHEN 325 MG PO TABS
650.0000 mg | ORAL_TABLET | Freq: Once | ORAL | Status: AC
Start: 2023-03-25 — End: 2023-03-25
  Administered 2023-03-25: 650 mg via ORAL
  Filled 2023-03-25: qty 2

## 2023-03-25 MED ORDER — METHOCARBAMOL 500 MG PO TABS: 500.0000 mg | ORAL_TABLET | Freq: Two times a day (BID) | ORAL | 0 refills | Status: AC

## 2023-03-25 MED ORDER — NAPROXEN 500 MG PO TABS: 500.0000 mg | ORAL_TABLET | Freq: Two times a day (BID) | ORAL | 0 refills | Status: AC | PRN
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
## 2023-11-18 ENCOUNTER — Encounter (HOSPITAL_BASED_OUTPATIENT_CLINIC_OR_DEPARTMENT_OTHER): Payer: Self-pay

## 2023-11-18 ENCOUNTER — Other Ambulatory Visit: Payer: Self-pay

## 2023-11-18 DIAGNOSIS — S161XXA Strain of muscle, fascia and tendon at neck level, initial encounter: Secondary | ICD-10-CM | POA: Diagnosis not present

## 2023-11-18 DIAGNOSIS — R519 Headache, unspecified: Secondary | ICD-10-CM | POA: Insufficient documentation

## 2023-11-18 DIAGNOSIS — M79601 Pain in right arm: Secondary | ICD-10-CM | POA: Insufficient documentation

## 2023-11-18 DIAGNOSIS — S199XXA Unspecified injury of neck, initial encounter: Secondary | ICD-10-CM | POA: Diagnosis present
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
## 2023-11-19 ENCOUNTER — Emergency Department (HOSPITAL_BASED_OUTPATIENT_CLINIC_OR_DEPARTMENT_OTHER)
Admission: EM | Admit: 2023-11-19 | Discharge: 2023-11-19 | Disposition: A | Discharge status: Home / Self Care | Attending: Emergency Medicine | Admitting: Emergency Medicine

## 2023-11-19 DIAGNOSIS — S161XXA Strain of muscle, fascia and tendon at neck level, initial encounter: Secondary | ICD-10-CM

## 2023-11-19 MED ORDER — OXYCODONE-ACETAMINOPHEN 5-325 MG PO TABS: 1.0000 | ORAL_TABLET | ORAL | 0 refills | Status: AC | PRN

## 2023-11-19 MED ORDER — KETOROLAC TROMETHAMINE 30 MG/ML IJ SOLN
30.0000 mg | Freq: Once | INTRAMUSCULAR | Status: AC
  Administered 2023-11-19: 30 mg via INTRAMUSCULAR
  Filled 2023-11-19: qty 1

## 2023-11-19 MED ORDER — OXYCODONE-ACETAMINOPHEN 5-325 MG PO TABS
1.0000 | ORAL_TABLET | Freq: Once | ORAL | Status: AC
  Administered 2023-11-19: 1 via ORAL
  Filled 2023-11-19: qty 1

## 2023-11-19 MED ORDER — DEXAMETHASONE SODIUM PHOSPHATE 10 MG/ML IJ SOLN
10.0000 mg | Freq: Once | INTRAMUSCULAR | Status: AC
Start: 2023-11-19 — End: 2023-11-19
  Administered 2023-11-19: 10 mg via INTRAMUSCULAR
  Filled 2023-11-19: qty 1
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
## 2023-11-22 ENCOUNTER — Other Ambulatory Visit: Payer: Self-pay

## 2023-11-22 ENCOUNTER — Ambulatory Visit
Admission: RE | Admit: 2023-11-22 | Discharge: 2023-11-22 | Disposition: A | Discharge status: Home / Self Care | Payer: Self-pay | Source: Ambulatory Visit

## 2023-11-22 DIAGNOSIS — M25512 Pain in left shoulder: Secondary | ICD-10-CM

## 2023-11-22 DIAGNOSIS — M546 Pain in thoracic spine: Secondary | ICD-10-CM

## 2023-11-22 DIAGNOSIS — M542 Cervicalgia: Secondary | ICD-10-CM

## 2023-11-22 DIAGNOSIS — M545 Low back pain, unspecified: Secondary | ICD-10-CM

## 2023-12-11 ENCOUNTER — Other Ambulatory Visit: Payer: Self-pay

## 2023-12-11 ENCOUNTER — Encounter (HOSPITAL_BASED_OUTPATIENT_CLINIC_OR_DEPARTMENT_OTHER): Payer: Self-pay | Admitting: Emergency Medicine

## 2023-12-11 ENCOUNTER — Emergency Department (HOSPITAL_BASED_OUTPATIENT_CLINIC_OR_DEPARTMENT_OTHER)
Admission: EM | Admit: 2023-12-11 | Discharge: 2023-12-11 | Disposition: A | Discharge status: Home / Self Care | Payer: Self-pay | Attending: Emergency Medicine | Admitting: Emergency Medicine

## 2023-12-11 DIAGNOSIS — I10 Essential (primary) hypertension: Secondary | ICD-10-CM | POA: Insufficient documentation

## 2023-12-11 DIAGNOSIS — F17210 Nicotine dependence, cigarettes, uncomplicated: Secondary | ICD-10-CM | POA: Insufficient documentation

## 2023-12-11 DIAGNOSIS — R21 Rash and other nonspecific skin eruption: Secondary | ICD-10-CM | POA: Insufficient documentation

## 2023-12-11 DIAGNOSIS — Z79899 Other long term (current) drug therapy: Secondary | ICD-10-CM | POA: Insufficient documentation

## 2023-12-11 MED ORDER — CETIRIZINE HCL 10 MG PO CHEW: 10.0000 mg | CHEWABLE_TABLET | Freq: Every day | ORAL | 0 refills | Status: AC | PRN

## 2023-12-11 MED ORDER — PREDNISONE 20 MG PO TABS: 40.0000 mg | ORAL_TABLET | Freq: Every day | ORAL | 0 refills | Status: AC

## 2023-12-11 MED ORDER — PREDNISONE 50 MG PO TABS
60.0000 mg | ORAL_TABLET | Freq: Once | ORAL | Status: AC
  Administered 2023-12-11: 60 mg via ORAL
  Filled 2023-12-11: qty 1

## 2023-12-11 NOTE — ED Provider Notes (Signed)
 [facility] Provider Note   CSN: [id number] Arrival date & time: 12/11/23  1146     Patient presents with: Rash   Alexa Hodge is a 38 y.o. female.    Rash   38 year old female presents emergency department with complaints of rash.  Rash present for the past 3 days.  States that it has been itching.  Has tried hydrocortisone cream without significant improvement.  Denies any fevers, chills, difficulty breathing/swallowing, feelings of throat closing RRR, abdominal pain, chest pain.  Denies any new medications.  Denies any known allergen exposure or change in hygiene products such as fragrances, soaps, lotions, detergents, etc.  Past medical history significant for htn.  Prior to Admission medications   Medication Sig Start Date End Date Taking? Authorizing Provider  cetirizine (ZYRTEC) 10 MG chewable tablet Chew 1 tablet (10 mg total) by mouth daily as needed for allergies. 12/11/23  Yes Silver Fell A, PA  predniSONE (DELTASONE) 20 MG tablet Take 2 tablets (40 mg total) by mouth daily with breakfast for 6 days. 12/12/23 12/18/23 Yes Silver Fell A, PA  hydrochlorothiazide  (HYDRODIURIL ) 25 MG tablet Take 1 tablet (25 mg total) by mouth daily. 08/15/22   Mesner, Selinda, MD  LORazepam  (ATIVAN ) 1 MG tablet Take 1 tablet (1 mg total) by mouth 3 (three) times daily as needed for anxiety. 08/15/22   Mesner, Selinda, MD  methocarbamol  (ROBAXIN ) 500 MG tablet Take 1 tablet (500 mg total) by mouth 2 (two) times daily. 03/25/23   Roselyn Carlin NOVAK, MD  naproxen  (NAPROSYN ) 500 MG tablet Take 1 tablet (500 mg total) by mouth 2 (two) times daily as needed. Take 1 tablet twice daily as needed for rib pain. 03/25/23   Roselyn Carlin NOVAK, MD  oxyCODONE -acetaminophen  (PERCOCET) 5-325 MG tablet Take 1 tablet by mouth every 4 (four) hours as needed. 11/19/23   Haze Lonni PARAS, MD    Allergies: Patient has no known allergies.    Review of Systems  Skin:   Positive for rash.  All other systems reviewed and are negative.   Updated Vital Signs BP (!) 155/118   Pulse 84   Temp 98.2 F (36.8 C) (Oral)   Resp 16   Wt 67.6 kg   LMP 12/05/2023   SpO2 100%   BMI 28.15 kg/m   Physical Exam Vitals and nursing note reviewed.  Constitutional:      General: She is not in acute distress.    Appearance: She is well-developed.  HENT:     Head: Normocephalic and atraumatic.     Mouth/Throat:     Mouth: Mucous membranes are moist.     Pharynx: Oropharynx is clear.   Eyes:     Conjunctiva/sclera: Conjunctivae normal.    Cardiovascular:     Rate and Rhythm: Normal rate and regular rhythm.     Heart sounds: No murmur heard. Pulmonary:     Effort: Pulmonary effort is normal. No respiratory distress.     Breath sounds: Normal breath sounds. No stridor. No wheezing, rhonchi or rales.  Abdominal:     Palpations: Abdomen is soft.     Tenderness: There is no abdominal tenderness.   Musculoskeletal:        General: No swelling.     Cervical back: Neck supple.   Skin:    General: Skin is warm and dry.     Capillary Refill: Capillary refill takes less than 2 seconds.     Findings: Rash present.  Comments: Maculopapular rash appreciated on abdomen, back, right proximal upper arm.   Neurological:     Mental Status: She is alert.   Psychiatric:        Mood and Affect: Mood normal.     (all labs ordered are listed, but only abnormal results are displayed) Labs Reviewed - No data to display  EKG: None  Radiology: No results found.   Procedures   Medications Ordered in the ED  predniSONE (DELTASONE) tablet 60 mg (has no administration in time range)                                    Medical Decision Making Risk OTC drugs. Prescription drug management.   This patient presents to the ED for concern of rash, this involves an extensive number of treatment options, and is a complaint that carries with it a high risk of  complications and morbidity.  The differential diagnosis includes SJS/TENS, cellulitis, or Supples, necrotizing infection, contact otitis, shingles, viral exanthem, other   Co morbidities that complicate the patient evaluation  See HPI   Additional history obtained:  Additional history obtained from EMR External records from outside source obtained and reviewed including hospital record   Lab Tests:  N/a   Imaging Studies ordered:  N/a   Cardiac Monitoring: / EKG:  N/a   Consultations Obtained:  N/a   Problem List / ED Course / Critical interventions / Medication management  Rash I ordered medication including prednisone   Reevaluation of the patient after these medicines showed that the patient stayed the same I have reviewed the patients home medicines and have made adjustments as needed   Social Determinants of Health:  Chronic cigarette use.  Denies illicit drug use.   Test / Admission - Considered:  Rash Vitals signs significant for hypertension. Otherwise within normal range and stable throughout visit. 38 year old female presents emergency department with complaints of rash.  Rash present for the past 3 days.  States that it has been itching.  Has tried hydrocortisone cream without significant improvement.  Denies any fevers, chills, difficulty breathing/swallowing, feelings of throat closing RRR, abdominal pain, chest pain.  Denies any new medications.  Denies any known allergen exposure or change in hygiene products such as fragrances, soaps, lotions, detergents, etc. On exam, maculopapular rash appreciated on abdomen, back, right upper arm as above.  Rash consistent with contact dermatitis.  No clinical evidence of anaphylaxis/angioedema.  Will treat with corticosteroids, antihistamines and recommend close follow-up with PCP in the outpatient setting.  Treatment plan discussed with patient and she acknowledged her stated was agreeable to said plan.   Patient overall well-appearing, afebrile in no acute distress. Worrisome signs and symptoms were discussed with the patient, and the patient acknowledged understanding to return to the ED if noticed. Patient was stable upon discharge.       Final diagnoses:  Rash    ED Discharge Orders          Ordered    predniSONE (DELTASONE) 20 MG tablet  Daily with breakfast        12/11/23 1208    cetirizine (ZYRTEC) 10 MG chewable tablet  Daily PRN        12/11/23 1208               Silver Wonda LABOR, PA 12/11/23 1226    Emil Share, DO 12/11/23 1348

## 2023-12-11 NOTE — Discharge Instructions (Signed)
 As discussed, rash consistent with allergic contact dermatitis.  Will treat this with prednisone over the next several days.  Recommend allergy medicine such as Zyrtec/creatinine/Allegra as well as hydrating lotion/ointments on the rash such as Aquaphor, Lubriderm, cerave.  Recommend follow-up with your primary care for reassessment.  Please do not hesitate to return to the emergency department if the worrisome signs and symptoms we discussed become apparent.

## 2023-12-11 NOTE — ED Triage Notes (Signed)
 All over body visible rash , itches she said , no relief when she used steroid cream . Denies fever .

## 2023-12-11 NOTE — ED Notes (Signed)
 Discharge instructions reviewed with patient. Patient verbalizes understanding, no further questions at this time. Medications/prescriptions and follow up information provided. No acute distress noted at time of departure.
